# Patient Record
Sex: Female | Born: 1955 | Race: White | Hispanic: No | Marital: Married | Smoking: Former smoker
Health system: Southern US, Community
[De-identification: ages and names within clinical notes are randomized; demographics above are authoritative.]

## PROBLEM LIST (undated history)

## (undated) DIAGNOSIS — F419 Anxiety disorder, unspecified: Secondary | ICD-10-CM

## (undated) DIAGNOSIS — I10 Essential (primary) hypertension: Secondary | ICD-10-CM

## (undated) DIAGNOSIS — IMO0001 Reserved for inherently not codable concepts without codable children: Secondary | ICD-10-CM

## (undated) DIAGNOSIS — T7840XA Allergy, unspecified, initial encounter: Secondary | ICD-10-CM

## (undated) DIAGNOSIS — E785 Hyperlipidemia, unspecified: Secondary | ICD-10-CM

## (undated) DIAGNOSIS — K219 Gastro-esophageal reflux disease without esophagitis: Secondary | ICD-10-CM

## (undated) DIAGNOSIS — F32A Depression, unspecified: Secondary | ICD-10-CM

## (undated) DIAGNOSIS — M1990 Unspecified osteoarthritis, unspecified site: Secondary | ICD-10-CM

## (undated) DIAGNOSIS — I341 Nonrheumatic mitral (valve) prolapse: Secondary | ICD-10-CM

## (undated) HISTORY — DX: Unspecified osteoarthritis, unspecified site: M19.90

## (undated) HISTORY — DX: Anxiety disorder, unspecified: F41.9

## (undated) HISTORY — DX: Hyperlipidemia, unspecified: E78.5

## (undated) HISTORY — DX: Nonrheumatic mitral (valve) prolapse: I34.1

## (undated) HISTORY — DX: Gastro-esophageal reflux disease without esophagitis: K21.9

## (undated) HISTORY — DX: Reserved for inherently not codable concepts without codable children: IMO0001

## (undated) HISTORY — DX: Essential (primary) hypertension: I10

## (undated) HISTORY — PX: COLONOSCOPY: SHX174

## (undated) HISTORY — DX: Depression, unspecified: F32.A

## (undated) HISTORY — DX: Allergy, unspecified, initial encounter: T78.40XA

---

## 2002-09-04 ENCOUNTER — Ambulatory Visit (HOSPITAL_COMMUNITY): Admission: RE | Admit: 2002-09-04 | Discharge: 2002-09-04 | Payer: Self-pay | Admitting: Pulmonary Disease

## 2004-12-20 ENCOUNTER — Ambulatory Visit (HOSPITAL_COMMUNITY): Admission: RE | Admit: 2004-12-20 | Discharge: 2004-12-20 | Payer: Self-pay | Admitting: Pulmonary Disease

## 2006-02-26 ENCOUNTER — Ambulatory Visit (HOSPITAL_COMMUNITY): Admission: RE | Admit: 2006-02-26 | Discharge: 2006-02-26 | Payer: Self-pay | Admitting: Pulmonary Disease

## 2006-04-22 ENCOUNTER — Ambulatory Visit: Payer: Self-pay | Admitting: Orthopedic Surgery

## 2006-04-23 ENCOUNTER — Encounter (HOSPITAL_COMMUNITY): Admission: RE | Admit: 2006-04-23 | Discharge: 2006-05-23 | Payer: Self-pay | Admitting: Orthopedic Surgery

## 2006-05-13 ENCOUNTER — Ambulatory Visit: Payer: Self-pay | Admitting: Orthopedic Surgery

## 2007-02-27 ENCOUNTER — Ambulatory Visit (HOSPITAL_COMMUNITY): Admission: RE | Admit: 2007-02-27 | Discharge: 2007-02-27 | Payer: Self-pay | Admitting: Family Medicine

## 2007-03-11 ENCOUNTER — Ambulatory Visit (HOSPITAL_COMMUNITY): Admission: RE | Admit: 2007-03-11 | Discharge: 2007-03-11 | Payer: Self-pay | Admitting: Family Medicine

## 2007-07-14 ENCOUNTER — Ambulatory Visit: Payer: Self-pay | Admitting: Orthopedic Surgery

## 2008-04-14 ENCOUNTER — Ambulatory Visit (HOSPITAL_COMMUNITY): Admission: RE | Admit: 2008-04-14 | Discharge: 2008-04-14 | Payer: Self-pay | Admitting: Family Medicine

## 2008-05-03 ENCOUNTER — Ambulatory Visit (HOSPITAL_COMMUNITY): Admission: RE | Admit: 2008-05-03 | Discharge: 2008-05-03 | Payer: Self-pay | Admitting: Family Medicine

## 2008-10-07 ENCOUNTER — Ambulatory Visit (HOSPITAL_COMMUNITY): Admission: RE | Admit: 2008-10-07 | Discharge: 2008-10-07 | Payer: Self-pay | Admitting: Family Medicine

## 2010-02-21 ENCOUNTER — Ambulatory Visit (HOSPITAL_COMMUNITY): Admission: RE | Admit: 2010-02-21 | Discharge: 2010-02-21 | Payer: Self-pay | Admitting: Family Medicine

## 2011-05-16 ENCOUNTER — Other Ambulatory Visit (HOSPITAL_COMMUNITY): Payer: Self-pay | Admitting: Family Medicine

## 2011-05-16 DIAGNOSIS — Z139 Encounter for screening, unspecified: Secondary | ICD-10-CM

## 2011-05-21 ENCOUNTER — Ambulatory Visit (HOSPITAL_COMMUNITY)
Admission: RE | Admit: 2011-05-21 | Discharge: 2011-05-21 | Disposition: A | Discharge status: Home / Self Care | Payer: 59 | Source: Ambulatory Visit | Attending: Family Medicine | Admitting: Family Medicine

## 2011-05-21 DIAGNOSIS — Z139 Encounter for screening, unspecified: Secondary | ICD-10-CM

## 2011-05-21 DIAGNOSIS — Z1231 Encounter for screening mammogram for malignant neoplasm of breast: Secondary | ICD-10-CM | POA: Insufficient documentation

## 2012-04-23 ENCOUNTER — Ambulatory Visit (INDEPENDENT_AMBULATORY_CARE_PROVIDER_SITE_OTHER): Payer: 59 | Admitting: Orthopedic Surgery

## 2012-04-23 ENCOUNTER — Ambulatory Visit (HOSPITAL_COMMUNITY)
Admission: RE | Admit: 2012-04-23 | Discharge: 2012-04-23 | Disposition: A | Discharge status: Home / Self Care | Payer: 59 | Source: Ambulatory Visit | Attending: Family Medicine | Admitting: Family Medicine

## 2012-04-23 ENCOUNTER — Encounter: Payer: Self-pay | Admitting: Orthopedic Surgery

## 2012-04-23 ENCOUNTER — Other Ambulatory Visit (HOSPITAL_COMMUNITY): Payer: Self-pay | Admitting: Family Medicine

## 2012-04-23 VITALS — BP 130/90 | Ht 64.0 in | Wt 172.0 lb

## 2012-04-23 DIAGNOSIS — M25579 Pain in unspecified ankle and joints of unspecified foot: Secondary | ICD-10-CM

## 2012-04-23 DIAGNOSIS — X58XXXA Exposure to other specified factors, initial encounter: Secondary | ICD-10-CM | POA: Insufficient documentation

## 2012-04-23 DIAGNOSIS — S92309A Fracture of unspecified metatarsal bone(s), unspecified foot, initial encounter for closed fracture: Secondary | ICD-10-CM

## 2012-04-23 DIAGNOSIS — M79609 Pain in unspecified limb: Secondary | ICD-10-CM | POA: Insufficient documentation

## 2012-04-23 NOTE — Patient Instructions (Signed)
Ice and elevation x 48 hrs   Wear brace on foot   Weight as bearing as tolerated   RTW Monday

## 2012-04-23 NOTE — Progress Notes (Signed)
  Subjective:    Patient ID: Amy Patel, female    DOB: 1956-09-17, 56 y.o.   MRN: [id number]  Foot Injury  The incident occurred 12 to 24 hours ago. The incident occurred at home. The injury mechanism was a fall. The pain is present in the right foot. The quality of the pain is described as aching. The pain is at a severity of 2/10. The pain is mild. The pain has been intermittent since onset. Associated symptoms include an inability to bear weight and a loss of motion. Associated symptoms comments: Bruising and swelling.      Review of Systems  Skin:       Skin changes   All other systems reviewed and are negative.       Objective:   Physical Exam  Constitutional: She is oriented to person, place, and time. She appears well-developed and well-nourished.  HENT:  Head: Normocephalic.  Neck: Normal range of motion.  Cardiovascular: Normal rate.   Pulmonary/Chest: Effort normal.  Abdominal: She exhibits no distension.  Musculoskeletal:       Right shoulder: Normal.       Left shoulder: Normal.       Right hip: Normal.       Right knee: Normal.       Right ankle: She exhibits swelling and ecchymosis. She exhibits normal range of motion, no deformity, no laceration and normal pulse. tenderness. No lateral malleolus, no medial malleolus, no AITFL, no CF ligament, no posterior TFL, no head of 5th metatarsal and no proximal fibula tenderness found.       Tenderness and swelling over the proximal 5th metatarsal, the Achilles tendon is intact in the anterior talofibular ligament is nontender  Neurological: She is alert and oriented to person, place, and time. She has normal reflexes.  Skin: Skin is warm and dry.  Psychiatric: She has a normal mood and affect. Her behavior is normal. Judgment and thought content normal.    X-ray shows a proximal 5th metatarsal avulsion-type fracture Hospital film      Assessment & Plan:  Proximal 5th metatarsal fracture.  Cam Walker  weight-bear as tolerated for 6 weeks repeat x-ray

## 2012-06-03 ENCOUNTER — Telehealth: Payer: Self-pay | Admitting: Orthopedic Surgery

## 2012-06-03 NOTE — Telephone Encounter (Signed)
Amy Patel cancelled her appointment for 06/04/12 due to her work schedule.  She was scheduled to get an XR at this appointment. She wanted you to know that she will get the XR done 06/04/12 at [facility] and asked if you will look at it on line and let her know if she  Needs to see you again or if you have any further recommendations for her. Her cell # is [phone]  Work # is [phone]

## 2012-06-04 ENCOUNTER — Ambulatory Visit: Payer: 59 | Admitting: Orthopedic Surgery

## 2012-06-04 ENCOUNTER — Other Ambulatory Visit (HOSPITAL_COMMUNITY): Payer: Self-pay | Admitting: Family Medicine

## 2012-06-04 ENCOUNTER — Ambulatory Visit (HOSPITAL_COMMUNITY)
Admission: RE | Admit: 2012-06-04 | Discharge: 2012-06-04 | Disposition: A | Discharge status: Home / Self Care | Payer: 59 | Source: Ambulatory Visit | Attending: Family Medicine | Admitting: Family Medicine

## 2012-06-04 DIAGNOSIS — S92909A Unspecified fracture of unspecified foot, initial encounter for closed fracture: Secondary | ICD-10-CM

## 2012-06-04 DIAGNOSIS — Z4789 Encounter for other orthopedic aftercare: Secondary | ICD-10-CM | POA: Insufficient documentation

## 2012-06-04 NOTE — Telephone Encounter (Signed)
Marijean Niemann, will you call these instructions to the patient?

## 2012-06-04 NOTE — Telephone Encounter (Signed)
Remove brace if not having pain   Continue 2 more weeks if having pain

## 2012-06-06 NOTE — Telephone Encounter (Signed)
Left patient voicemail.

## 2012-08-26 ENCOUNTER — Other Ambulatory Visit (HOSPITAL_COMMUNITY): Payer: Self-pay | Admitting: Pulmonary Disease

## 2012-08-26 DIAGNOSIS — Z139 Encounter for screening, unspecified: Secondary | ICD-10-CM

## 2012-08-27 ENCOUNTER — Other Ambulatory Visit (HOSPITAL_COMMUNITY): Payer: Self-pay | Admitting: Family Medicine

## 2012-08-27 DIAGNOSIS — Z139 Encounter for screening, unspecified: Secondary | ICD-10-CM

## 2012-09-08 ENCOUNTER — Ambulatory Visit (HOSPITAL_COMMUNITY)
Admission: RE | Admit: 2012-09-08 | Discharge: 2012-09-08 | Disposition: A | Discharge status: Home / Self Care | Payer: 59 | Source: Ambulatory Visit | Attending: Pulmonary Disease | Admitting: Pulmonary Disease

## 2012-09-08 ENCOUNTER — Ambulatory Visit (HOSPITAL_COMMUNITY)
Admission: RE | Admit: 2012-09-08 | Discharge: 2012-09-08 | Disposition: A | Discharge status: Home / Self Care | Payer: 59 | Source: Ambulatory Visit | Attending: Family Medicine | Admitting: Family Medicine

## 2012-09-08 DIAGNOSIS — Z13820 Encounter for screening for osteoporosis: Secondary | ICD-10-CM | POA: Insufficient documentation

## 2012-09-08 DIAGNOSIS — Z1231 Encounter for screening mammogram for malignant neoplasm of breast: Secondary | ICD-10-CM | POA: Insufficient documentation

## 2012-09-08 DIAGNOSIS — Z139 Encounter for screening, unspecified: Secondary | ICD-10-CM

## 2012-09-08 DIAGNOSIS — E559 Vitamin D deficiency, unspecified: Secondary | ICD-10-CM | POA: Insufficient documentation

## 2014-02-09 ENCOUNTER — Other Ambulatory Visit (HOSPITAL_COMMUNITY): Payer: Self-pay | Admitting: Pulmonary Disease

## 2014-02-09 DIAGNOSIS — Z139 Encounter for screening, unspecified: Secondary | ICD-10-CM

## 2014-02-11 ENCOUNTER — Ambulatory Visit (HOSPITAL_COMMUNITY)
Admission: RE | Admit: 2014-02-11 | Discharge: 2014-02-11 | Disposition: A | Discharge status: Home / Self Care | Payer: 59 | Source: Ambulatory Visit | Attending: Pulmonary Disease | Admitting: Pulmonary Disease

## 2014-02-11 DIAGNOSIS — Z139 Encounter for screening, unspecified: Secondary | ICD-10-CM

## 2014-02-11 DIAGNOSIS — Z1231 Encounter for screening mammogram for malignant neoplasm of breast: Secondary | ICD-10-CM | POA: Insufficient documentation

## 2014-10-14 ENCOUNTER — Ambulatory Visit (INDEPENDENT_AMBULATORY_CARE_PROVIDER_SITE_OTHER): Payer: BC Managed Care – PPO | Admitting: Orthopedic Surgery

## 2014-10-14 ENCOUNTER — Ambulatory Visit (INDEPENDENT_AMBULATORY_CARE_PROVIDER_SITE_OTHER): Payer: BC Managed Care – PPO

## 2014-10-14 ENCOUNTER — Encounter: Payer: Self-pay | Admitting: Orthopedic Surgery

## 2014-10-14 VITALS — BP 149/91 | Ht 64.0 in | Wt 198.0 lb

## 2014-10-14 DIAGNOSIS — S83242A Other tear of medial meniscus, current injury, left knee, initial encounter: Secondary | ICD-10-CM

## 2014-10-14 DIAGNOSIS — M25562 Pain in left knee: Secondary | ICD-10-CM

## 2014-10-14 NOTE — Progress Notes (Signed)
Patient ID: Amy Patel, female   DOB: 05/16/1956, 58 y.o.   MRN: [id number] Patient ID: Amy Patel, female   DOB: 12-31-1955, 58 y.o.   MRN: [id number]  Chief Complaint  Patient presents with  . Knee Pain    left knee pain, no known injury    HPI Amy Patel is a 58 y.o. female.  Who started having pain in her knee when she was going up and down the stairs and doing Christmas decorations including the Christmas tree. Nothing specific in the beginning just some pain. This increased during a trip to [place] to improve pain swelling and popping. She was treated with 60 mg of prednisone for 3 days and 50 mg times a day along with the brace. She was doing okay until yesterday after shopping the knee popped role sort of gave way and pain became to 10 out of 10 she can extend the knee or weight-bear. Her pain is primarily across the medial joint line and is associated with a little bit of swelling. No locking. Pain is somewhat dull and achy today is 1 out of 10. She was also treated with ice and Advil and bracing as stated.   HPI  Review of Systems Review of Systems  She reported her review of systems as 14 systems being negative Past Medical History  Diagnosis Date  . Reflux   . MVP (mitral valve prolapse)   . Hyperlipidemia     No past surgical history on file.  Family History  Problem Relation Age of Onset  . Heart disease    . Arthritis    . Cancer      Social History History  Substance Use Topics  . Smoking status: Never Smoker   . Smokeless tobacco: Not on file  . Alcohol Use: Yes    Allergies  Allergen Reactions  . Penicillins     Current Outpatient Prescriptions  Medication Sig Dispense Refill  . aspirin 81 MG tablet Take 81 mg by mouth daily.    . Azilsartan Medoxomil (EDARBI) 80 MG TABS Take by mouth.    . dexlansoprazole (DEXILANT) 60 MG capsule Take 60 mg by mouth daily.    . Nebivolol HCl (BYSTOLIC) 20 MG TABS Take by mouth.    .  rosuvastatin (CRESTOR) 10 MG tablet Take 10 mg by mouth daily.     No current facility-administered medications for this visit.       Physical Exam Blood pressure 149/91, height 5\' 4"  (1.626 m), weight 198 lb (89.812 kg). Physical Exam Vitals are stable as recorded, appearance is normal grooming is normal as well. She is oriented 3. Affect are normal. She is ambulatory with no assistive device (the brace and there is a slight limp and she turns the foot out weightbearing.  She is tender over the medial joint line. Her range of motion is full but painful at terminal flexion she has a positive Apley's test positive McMurray sign her knee remains stable motor exam is normal skin is intact she has a good pulse in her sensation is normal in the left leg  Right knee exam is normal Upper extremities no abnormalities are detected.  Data Reviewed Independent interpretation of today's x-ray joint effusion with very minimal medial joint space narrowing  Assessment    Knee arthritis Joint effusion Torn medial meniscus    Plan    Aspiration injection left knee continue brace and anti-inflammatories. Recheck after the holidays or sooner if symptoms warrant  Procedure note injection and aspiration left knee joint  Verbal consent was obtained to aspirate and inject the left knee joint   Timeout was completed to confirm the site of aspiration and injection  An 18-gauge needle was used to aspirate the left knee joint from a suprapatellar lateral approach.  The medications used were 40 mg of Depo-Medrol and 1% lidocaine 3 cc  Anesthesia was provided by ethyl chloride and the skin was prepped with alcohol.  After cleaning the skin with alcohol an 18-gauge needle was used to aspirate the right knee joint.  We obtained 10 cc of fluid  We follow this by injection of 40 mg of Depo-Medrol and 3 cc 1% lidocaine.  There were no complications. A sterile bandage was applied.

## 2014-10-14 NOTE — Patient Instructions (Signed)
Ice as needed  Activity as tolerated   Wear brace  Anti inflammatories

## 2014-11-11 ENCOUNTER — Ambulatory Visit: Payer: BC Managed Care – PPO | Admitting: Orthopedic Surgery

## 2014-11-11 ENCOUNTER — Telehealth: Payer: Self-pay | Admitting: Orthopedic Surgery

## 2014-11-11 NOTE — Telephone Encounter (Signed)
Patient needs to cancel appointment due to being ill today - I offered re-schedule -  She opts to call back to re-schedule; wants Dr Aline Brochure to know "knee is better".

## 2014-11-22 ENCOUNTER — Encounter: Payer: Self-pay | Admitting: Orthopedic Surgery

## 2014-11-22 ENCOUNTER — Ambulatory Visit (INDEPENDENT_AMBULATORY_CARE_PROVIDER_SITE_OTHER): Payer: BLUE CROSS/BLUE SHIELD | Admitting: Orthopedic Surgery

## 2014-11-22 VITALS — BP 138/80 | Ht 64.0 in | Wt 198.0 lb

## 2014-11-22 DIAGNOSIS — S83241D Other tear of medial meniscus, current injury, right knee, subsequent encounter: Secondary | ICD-10-CM

## 2014-11-22 NOTE — Progress Notes (Signed)
Chief Complaint  Patient presents with  . Follow-up    4 week recheck on left knee.    History this is a 59 year old female who started having pain in her left knee around Christmas time after going up and down the stairs. This increased during a trip to [place] and was associated with pain and popping. She was treated with prednisone and knee bracing as well as Advil and did okay until she went on a shopping trip and then the knee gave way pain increased to 10 and she can weight-bear.  We gave her an injection she comes in for follow-up visit  The patient notes major improvement in her knee. Her pain is less her motion is improved she has no limp.  Review of systems she says she cut her left thumb with the [place] carcass back and things given and now has developed a nodule on the left thumb just proximal to the laceration with no signs of infection but a white lesion in the superficial skin.  Exam BP 138/80 mmHg  Ht 5\' 4"  (1.626 m)  Wt 198 lb (89.812 kg)  BMI 33.97 kg/m2  The examination of the left knee reveals normal ambulation. Mild medial joint line tenderness without effusion. Full range of motion. Ligament stable. Strength normal. Skin normal. McMurray sign negative.  Left thumb there is a white 2 x 2 mm superficial lesion in the subcutaneous tissue of the thumb which has no sign of erythema or infection mildly tender  Meniscal tear stable no surgery treatment needed.  Lesion on the left thumb observation  Follow-up as needed

## 2015-08-02 ENCOUNTER — Other Ambulatory Visit (HOSPITAL_COMMUNITY): Payer: Self-pay | Admitting: Pulmonary Disease

## 2015-08-02 DIAGNOSIS — Z1231 Encounter for screening mammogram for malignant neoplasm of breast: Secondary | ICD-10-CM

## 2015-08-08 ENCOUNTER — Ambulatory Visit (HOSPITAL_COMMUNITY)
Admission: RE | Admit: 2015-08-08 | Discharge: 2015-08-08 | Disposition: A | Discharge status: Home / Self Care | Payer: BLUE CROSS/BLUE SHIELD | Source: Ambulatory Visit | Attending: Pulmonary Disease | Admitting: Pulmonary Disease

## 2015-08-08 DIAGNOSIS — Z1231 Encounter for screening mammogram for malignant neoplasm of breast: Secondary | ICD-10-CM | POA: Insufficient documentation

## 2016-01-24 ENCOUNTER — Ambulatory Visit (INDEPENDENT_AMBULATORY_CARE_PROVIDER_SITE_OTHER): Payer: BLUE CROSS/BLUE SHIELD | Admitting: Orthopedic Surgery

## 2016-01-24 VITALS — BP 136/73 | HR 68 | Ht 64.0 in | Wt 198.0 lb

## 2016-01-24 DIAGNOSIS — M25562 Pain in left knee: Secondary | ICD-10-CM

## 2016-01-24 DIAGNOSIS — S83241D Other tear of medial meniscus, current injury, right knee, subsequent encounter: Secondary | ICD-10-CM

## 2016-01-24 NOTE — Progress Notes (Signed)
60 year old female [place] in her knee started hurting again. We saw her back in this January and she was thought to have a medial meniscal tear that was stable and didn't require surgery  Requests repeat injection in the left knee which was given  Procedure note left knee injection verbal consent was obtained to inject left knee joint  Timeout was completed to confirm the site of injection  The medications used were 40 mg of Depo-Medrol and 1% lidocaine 3 cc  Anesthesia was provided by ethyl chloride and the skin was prepped with alcohol.  After cleaning the skin with alcohol a 20-gauge needle was used to inject the left knee joint. There were no complications. A sterile bandage was applied.

## 2016-02-16 ENCOUNTER — Ambulatory Visit: Payer: BLUE CROSS/BLUE SHIELD | Admitting: Orthopedic Surgery

## 2017-02-12 DIAGNOSIS — M1712 Unilateral primary osteoarthritis, left knee: Secondary | ICD-10-CM | POA: Diagnosis not present

## 2017-02-12 DIAGNOSIS — Z87891 Personal history of nicotine dependence: Secondary | ICD-10-CM | POA: Diagnosis not present

## 2017-02-12 DIAGNOSIS — I10 Essential (primary) hypertension: Secondary | ICD-10-CM | POA: Diagnosis not present

## 2017-02-12 DIAGNOSIS — F411 Generalized anxiety disorder: Secondary | ICD-10-CM | POA: Diagnosis not present

## 2017-02-26 ENCOUNTER — Other Ambulatory Visit: Payer: Self-pay | Admitting: Pulmonary Disease

## 2017-02-26 DIAGNOSIS — R05 Cough: Secondary | ICD-10-CM

## 2017-02-26 DIAGNOSIS — R059 Cough, unspecified: Secondary | ICD-10-CM

## 2017-02-28 ENCOUNTER — Ambulatory Visit (HOSPITAL_COMMUNITY)
Admission: RE | Admit: 2017-02-28 | Discharge: 2017-02-28 | Disposition: A | Discharge status: Home / Self Care | Payer: 59 | Source: Ambulatory Visit | Attending: Pulmonary Disease | Admitting: Pulmonary Disease

## 2017-02-28 DIAGNOSIS — R05 Cough: Secondary | ICD-10-CM | POA: Insufficient documentation

## 2017-02-28 DIAGNOSIS — R059 Cough, unspecified: Secondary | ICD-10-CM

## 2017-02-28 DIAGNOSIS — R0602 Shortness of breath: Secondary | ICD-10-CM | POA: Diagnosis not present

## 2017-06-12 DIAGNOSIS — H5213 Myopia, bilateral: Secondary | ICD-10-CM | POA: Diagnosis not present

## 2017-06-12 DIAGNOSIS — H524 Presbyopia: Secondary | ICD-10-CM | POA: Diagnosis not present

## 2017-06-12 DIAGNOSIS — H52223 Regular astigmatism, bilateral: Secondary | ICD-10-CM | POA: Diagnosis not present

## 2017-08-09 DIAGNOSIS — Z23 Encounter for immunization: Secondary | ICD-10-CM | POA: Diagnosis not present

## 2018-03-11 DIAGNOSIS — Z87891 Personal history of nicotine dependence: Secondary | ICD-10-CM | POA: Diagnosis not present

## 2018-03-11 DIAGNOSIS — F411 Generalized anxiety disorder: Secondary | ICD-10-CM | POA: Diagnosis not present

## 2018-03-11 DIAGNOSIS — E782 Mixed hyperlipidemia: Secondary | ICD-10-CM | POA: Diagnosis not present

## 2018-03-11 DIAGNOSIS — I10 Essential (primary) hypertension: Secondary | ICD-10-CM | POA: Diagnosis not present

## 2018-03-18 ENCOUNTER — Other Ambulatory Visit (HOSPITAL_COMMUNITY): Payer: Self-pay | Admitting: Pulmonary Disease

## 2018-03-18 DIAGNOSIS — Z1231 Encounter for screening mammogram for malignant neoplasm of breast: Secondary | ICD-10-CM

## 2018-03-25 ENCOUNTER — Ambulatory Visit (INDEPENDENT_AMBULATORY_CARE_PROVIDER_SITE_OTHER): Payer: 59

## 2018-03-25 ENCOUNTER — Encounter: Payer: Self-pay | Admitting: Orthopedic Surgery

## 2018-03-25 ENCOUNTER — Ambulatory Visit (INDEPENDENT_AMBULATORY_CARE_PROVIDER_SITE_OTHER): Payer: 59 | Admitting: Orthopedic Surgery

## 2018-03-25 VITALS — BP 138/90 | HR 80 | Ht 64.0 in | Wt 197.0 lb

## 2018-03-25 DIAGNOSIS — M25561 Pain in right knee: Secondary | ICD-10-CM

## 2018-03-25 DIAGNOSIS — M25461 Effusion, right knee: Secondary | ICD-10-CM | POA: Diagnosis not present

## 2018-03-25 NOTE — Progress Notes (Signed)
Progress Note//new problem  Patient ID: Amy Patel, female   DOB: Apr 06, 1956, 62 y.o.   MRN: [id number] Chief Complaint  Patient presents with  . Follow-up    Right knee pain     Medical decision-making  Imaging:  Xrays ordered: y/n ? Yes  The dictated report x-ray shows symmetric joint space narrowing which is minor normal alignment in the tibiofemoral joint and patellofemoral joint  Encounter Diagnosis  Name Primary?  . Right knee pain, unspecified chronicity Yes     PLAN:  aspiration and injection Procedure note injection and aspiration right knee joint  Verbal consent was obtained to aspirate and inject the right knee joint   Timeout was completed to confirm the site of aspiration and injection  An 18-gauge needle was used to aspirate the knee joint from a suprapatellar lateral approach.  The medications used were 40 mg of Depo-Medrol and 1% lidocaine 3 cc  Anesthesia was provided by ethyl chloride and the skin was prepped with alcohol.  After cleaning the skin with alcohol an 18-gauge needle was used to aspirate the right knee joint.  We obtained 10  cc of fluid, clear   We follow this by injection of 40 mg of Depo-Medrol and 3 cc 1% lidocaine.  There were no complications. A sterile bandage was applied.   Chief Complaint  Patient presents with  . Follow-up    Right knee pain    62 years old presents for evaluation of pain and swelling right knee  This patient is 62 years old she was wearing some high heels and twisted her right knee back in April initially took a prednisone Dosepak seem to improve and then over the last 3 to 4 weeks had increased dull medial pain right knee and swelling decreased flexion without giving way catching locking or weakness.    Review of Systems  Constitutional: Negative for fever.  Skin: Negative for rash.  Neurological: Negative for tingling and weakness.   No outpatient medications have been marked as taking for  the 03/25/18 encounter (Office Visit) with Carole Civil, MD.    Past Medical History:  Diagnosis Date  . Hyperlipidemia   . MVP (mitral valve prolapse)   . Reflux      Allergies  Allergen Reactions  . Penicillins     BP 138/90   Pulse 80   Ht 5\' 4"  (1.626 m)   Wt 197 lb (89.4 kg)   BMI 33.81 kg/m    Physical Exam  Constitutional: She is oriented to person, place, and time. She appears well-developed and well-nourished.  Musculoskeletal:       Right knee: She exhibits effusion.  Neurological: She is alert and oriented to person, place, and time.  Psychiatric: She has a normal mood and affect. Judgment normal.  Vitals reviewed.   Right Knee Exam   Muscle Strength  The patient has normal right knee strength.  Tenderness  The patient is experiencing tenderness in the medial joint line.  Range of Motion  Extension: normal  Flexion: normal   Tests  McMurray:  Medial - negative Lateral - negative Varus: negative Valgus: negative Drawer:  Anterior - negative    Posterior - negative  Other  Erythema: absent Scars: absent Sensation: normal Pulse: present Swelling: none Effusion: effusion present   Left Knee Exam   Muscle Strength  The patient has normal left knee strength.  Tenderness  The patient is experiencing no tenderness.   Range of Motion  Extension: normal  Flexion: normal   Tests  McMurray:  Medial - negative Lateral - negative Varus: negative Valgus: negative Drawer:  Anterior - negative     Posterior - negative  Other  Erythema: absent Scars: absent Sensation: normal Pulse: present Swelling: none      Arther Abbott, MD  9:02 AM 03/25/2018

## 2018-03-25 NOTE — Patient Instructions (Signed)
Ice   advil

## 2018-03-27 ENCOUNTER — Encounter (HOSPITAL_COMMUNITY): Payer: Self-pay

## 2018-03-27 ENCOUNTER — Ambulatory Visit (HOSPITAL_COMMUNITY)
Admission: RE | Admit: 2018-03-27 | Discharge: 2018-03-27 | Disposition: A | Discharge status: Home / Self Care | Payer: 59 | Source: Ambulatory Visit | Attending: Pulmonary Disease | Admitting: Pulmonary Disease

## 2018-03-27 DIAGNOSIS — Z1231 Encounter for screening mammogram for malignant neoplasm of breast: Secondary | ICD-10-CM | POA: Diagnosis present

## 2018-04-22 ENCOUNTER — Encounter: Payer: Self-pay | Admitting: Gastroenterology

## 2018-05-20 ENCOUNTER — Encounter: Payer: Self-pay | Admitting: Orthopedic Surgery

## 2018-05-20 ENCOUNTER — Ambulatory Visit (INDEPENDENT_AMBULATORY_CARE_PROVIDER_SITE_OTHER): Payer: 59 | Admitting: Orthopedic Surgery

## 2018-05-20 VITALS — BP 140/79 | HR 73 | Ht 64.0 in | Wt 197.0 lb

## 2018-05-20 DIAGNOSIS — M23231 Derangement of other medial meniscus due to old tear or injury, right knee: Secondary | ICD-10-CM | POA: Diagnosis not present

## 2018-05-20 NOTE — Progress Notes (Signed)
Chief Complaint  Patient presents with  . Follow-up    Recheck on right knee.     62 year old female status post an aspiration injection right knee for pain and swelling after previous twisting injury in April did well for a small amount of time then was walking a few days back and the knee gave way.  She had been having pain every day despite taking Advil  Presents now with her husband needing surgery on Thursday and complaining of medial knee pain dull aching intermittent seems to be associated with weightbearing mild swelling  Review of Systems  Constitutional: Negative for chills and fever.  Musculoskeletal: Negative for back pain.  Neurological: Negative for tingling.   Past Medical History:  Diagnosis Date  . Hyperlipidemia   . MVP (mitral valve prolapse)   . Reflux    BP 140/79   Pulse 73   Ht 5\' 4"  (1.626 m)   Wt 197 lb (89.4 kg)   BMI 33.81 kg/m  Physical Exam  Constitutional: She is oriented to person, place, and time. She appears well-developed and well-nourished.  Neurological: She is alert and oriented to person, place, and time.  Psychiatric: She has a normal mood and affect. Judgment normal.  Vitals reviewed.  Knee:  Right knee medial joint line tenderness, no effusion Full range of motion with pain in extension and terminal flexion No instability but positive McMurray sign positive screw home test Assessment normal motor exam grade 5 Skin normal without any lesions or rashes Normal dorsalis pedis pulse normal color Normal sensation in the right foot  Left knee NROM, normal appearance, normal skin    Eco hinge brace  Right knee injection    Encounter Diagnosis  Name Primary?  Thelma Comp of medial meniscus due to old tear/inj, right knee Yes   Procedure note right knee injection verbal consent was obtained to inject right knee joint  Timeout was completed to confirm the site of injection  The medications used were 40 mg of Depo-Medrol and 1%  lidocaine 3 cc  Anesthesia was provided by ethyl chloride and the skin was prepped with alcohol.  After cleaning the skin with alcohol a 20-gauge needle was used to inject the right knee joint. There were no complications. A sterile bandage was applied.

## 2018-05-21 ENCOUNTER — Ambulatory Visit (AMBULATORY_SURGERY_CENTER): Payer: Self-pay

## 2018-05-21 VITALS — Ht 64.0 in | Wt 208.8 lb

## 2018-05-21 DIAGNOSIS — Z1211 Encounter for screening for malignant neoplasm of colon: Secondary | ICD-10-CM

## 2018-05-21 MED ORDER — NA SULFATE-K SULFATE-MG SULF 17.5-3.13-1.6 GM/177ML PO SOLN: 1.0000 | Freq: Once | ORAL | 0 refills | Status: AC

## 2018-05-21 NOTE — Progress Notes (Signed)
Denies allergies to eggs or soy products. Denies complication of anesthesia or sedation. Denies use of weight loss medication. Denies use of O2.   Emmi instructions declined.  

## 2018-05-27 ENCOUNTER — Encounter: Payer: Self-pay | Admitting: Gastroenterology

## 2018-06-03 DIAGNOSIS — R945 Abnormal results of liver function studies: Secondary | ICD-10-CM | POA: Diagnosis not present

## 2018-06-03 DIAGNOSIS — Z0000 Encounter for general adult medical examination without abnormal findings: Secondary | ICD-10-CM | POA: Diagnosis not present

## 2018-06-06 ENCOUNTER — Ambulatory Visit (AMBULATORY_SURGERY_CENTER): Payer: 59 | Admitting: Gastroenterology

## 2018-06-06 ENCOUNTER — Encounter: Payer: Self-pay | Admitting: Gastroenterology

## 2018-06-06 VITALS — BP 110/64 | HR 76 | Temp 99.3°F | Resp 21 | Ht 64.0 in | Wt 208.0 lb

## 2018-06-06 DIAGNOSIS — D123 Benign neoplasm of transverse colon: Secondary | ICD-10-CM

## 2018-06-06 DIAGNOSIS — D120 Benign neoplasm of cecum: Secondary | ICD-10-CM | POA: Diagnosis not present

## 2018-06-06 DIAGNOSIS — K635 Polyp of colon: Secondary | ICD-10-CM

## 2018-06-06 DIAGNOSIS — I10 Essential (primary) hypertension: Secondary | ICD-10-CM | POA: Diagnosis not present

## 2018-06-06 DIAGNOSIS — Z1211 Encounter for screening for malignant neoplasm of colon: Secondary | ICD-10-CM | POA: Diagnosis not present

## 2018-06-06 DIAGNOSIS — D125 Benign neoplasm of sigmoid colon: Secondary | ICD-10-CM

## 2018-06-06 DIAGNOSIS — D124 Benign neoplasm of descending colon: Secondary | ICD-10-CM | POA: Diagnosis not present

## 2018-06-06 MED ORDER — SODIUM CHLORIDE 0.9 % IV SOLN: 500.0000 mL | Freq: Once | INTRAVENOUS | Status: DC

## 2018-06-06 NOTE — Patient Instructions (Signed)
Please read handouts on Polyps, Diverticulosis, and Hemorrhoids. Continue present medications.    YOU HAD AN ENDOSCOPIC PROCEDURE TODAY AT [facility]:   Refer to the procedure report that was given to you for any specific questions about what was found during the examination.  If the procedure report does not answer your questions, please call your gastroenterologist to clarify.  If you requested that your care partner not be given the details of your procedure findings, then the procedure report has been included in a sealed envelope for you to review at your convenience later.  YOU SHOULD EXPECT: Some feelings of bloating in the abdomen. Passage of more gas than usual.  Walking can help get rid of the air that was put into your GI tract during the procedure and reduce the bloating. If you had a lower endoscopy (such as a colonoscopy or flexible sigmoidoscopy) you may notice spotting of blood in your stool or on the toilet paper. If you underwent a bowel prep for your procedure, you may not have a normal bowel movement for a few days.  Please Note:  You might notice some irritation and congestion in your nose or some drainage.  This is from the oxygen used during your procedure.  There is no need for concern and it should clear up in a day or so.  SYMPTOMS TO REPORT IMMEDIATELY:   Following lower endoscopy (colonoscopy or flexible sigmoidoscopy):  Excessive amounts of blood in the stool  Significant tenderness or worsening of abdominal pains  Swelling of the abdomen that is new, acute  Fever of 100F or higher    For urgent or emergent issues, a gastroenterologist can be reached at any hour by calling [phone].   DIET:  We do recommend a small meal at first, but then you may proceed to your regular diet.  Drink plenty of fluids but you should avoid alcoholic beverages for 24 hours.  ACTIVITY:  You should plan to take it easy for the rest of today and you should NOT  DRIVE or use heavy machinery until tomorrow (because of the sedation medicines used during the test).    FOLLOW UP: Our staff will call the number listed on your records the next business day following your procedure to check on you and address any questions or concerns that you may have regarding the information given to you following your procedure. If we do not reach you, we will leave a message.  However, if you are feeling well and you are not experiencing any problems, there is no need to return our call.  We will assume that you have returned to your regular daily activities without incident.  If any biopsies were taken you will be contacted by phone or by letter within the next 1-3 weeks.  Please call us at [phone] if you have not heard about the biopsies in 3 weeks.    SIGNATURES/CONFIDENTIALITY: You and/or your care partner have signed paperwork which will be entered into your electronic medical record.  These signatures attest to the fact that that the information above on your After Visit Summary has been reviewed and is understood.  Full responsibility of the confidentiality of this discharge information lies with you and/or your care-partner.

## 2018-06-06 NOTE — Progress Notes (Signed)
Report to PACU, RN, vss, BBS= Clear.  

## 2018-06-06 NOTE — Progress Notes (Signed)
Called to room to assist during endoscopic procedure.  Patient ID and intended procedure confirmed with present staff. Received instructions for my participation in the procedure from the performing physician.  

## 2018-06-06 NOTE — Progress Notes (Signed)
Pt's states no medical or surgical changes since previsit or office visit. 

## 2018-06-06 NOTE — Op Note (Signed)
[place] Patient Name: Amy Patel Procedure Date: 06/06/2018 3:17 PM MRN: [id number] Endoscopist: Mauri Pole , MD Age: 62 Referring MD:  Date of Birth: [birth year] Gender: Female Account #: [id number] Procedure:                Colonoscopy Indications:              Screening for colorectal malignant neoplasm. Father                            had colon cancer at age >45 Medicines:                Monitored Anesthesia Care Procedure:                Pre-Anesthesia Assessment:                           - Prior to the procedure, a History and Physical                            was performed, and patient medications and                            allergies were reviewed. The patient's tolerance of                            previous anesthesia was also reviewed. The risks                            and benefits of the procedure and the sedation                            options and risks were discussed with the patient.                            All questions were answered, and informed consent                            was obtained. Prior Anticoagulants: The patient has                            taken no previous anticoagulant or antiplatelet                            agents. ASA Grade Assessment: II - A patient with                            mild systemic disease. After reviewing the risks                            and benefits, the patient was deemed in                            satisfactory condition to undergo the procedure.  After obtaining informed consent, the colonoscope                            was passed under direct vision. Throughout the                            procedure, the patient's blood pressure, pulse, and                            oxygen saturations were monitored continuously. The                            Model PCF-[id number]) scope was introduced                            through the anus and  advanced to the the cecum,                            identified by appendiceal orifice and ileocecal                            valve. The colonoscopy was performed without                            difficulty. The patient tolerated the procedure                            well. The quality of the bowel preparation was                            excellent. The ileocecal valve, appendiceal                            orifice, and rectum were photographed. Scope In: 3:25:08 PM Scope Out: 3:44:23 PM Scope Withdrawal Time: 0 hours 11 minutes 38 seconds  Total Procedure Duration: 0 hours 19 minutes 15 seconds  Findings:                 The perianal and digital rectal examinations were                            normal.                           Four sessile polyps were found in the descending                            colon and cecum. The polyps were 4 to 7 mm in size.                            These polyps were removed with a cold snare.                            Resection and retrieval were complete.  A 1 mm polyp was found in the transverse colon. The                            polyp was sessile. The polyp was removed with a                            cold biopsy forceps. Resection and retrieval were                            complete.                           A 11 mm polyp was found in the sigmoid colon. The                            polyp was pedunculated. The polyp was removed with                            a hot snare. Resection and retrieval were complete.                           Scattered small-mouthed diverticula were found in                            the sigmoid colon, descending colon and ascending                            colon.                           Non-bleeding internal hemorrhoids were found during                            retroflexion. The hemorrhoids were small. Complications:            No immediate complications. Estimated  Blood Loss:     Estimated blood loss was minimal. Impression:               - Four 4 to 7 mm polyps in the descending colon and                            in the cecum, removed with a cold snare. Resected                            and retrieved.                           - One 1 mm polyp in the transverse colon, removed                            with a cold biopsy forceps. Resected and retrieved.                           - One 11 mm polyp in the sigmoid colon,  removed                            with a hot snare. Resected and retrieved.                           - Mild diverticulosis in the sigmoid colon, in the                            descending colon and in the ascending colon.                           - Non-bleeding internal hemorrhoids. Recommendation:           - Patient has a contact number available for                            emergencies. The signs and symptoms of potential                            delayed complications were discussed with the                            patient. Return to normal activities tomorrow.                            Written discharge instructions were provided to the                            patient.                           - Resume previous diet.                           - Continue present medications.                           - Await pathology results.                           - Repeat colonoscopy in 3 - 5 years for                            surveillance based on pathology results. Mauri Pole, MD 06/06/2018 3:47:45 PM This report has been signed electronically.

## 2018-06-06 NOTE — Progress Notes (Deleted)
Called to room to assist during endoscopic procedure.  Patient ID and intended procedure confirmed with present staff. Received instructions for my participation in the procedure from the performing physician.  

## 2018-06-09 ENCOUNTER — Telehealth: Payer: Self-pay | Admitting: *Deleted

## 2018-06-09 NOTE — Telephone Encounter (Signed)
  Follow up Call-  Call back number 06/06/2018  Post procedure Call Back phone  # [phone]  Permission to leave phone message Yes  Some recent data might be hidden     Patient questions:  Do you have a fever, pain , or abdominal swelling? No. Pain Score  0 *  Have you tolerated food without any problems? Yes.    Have you been able to return to your normal activities? Yes.    Do you have any questions about your discharge instructions: Diet   No. Medications  No. Follow up visit  No.  Do you have questions or concerns about your Care? No.  Actions: * If pain score is 4 or above: No action needed, pain <4.

## 2018-06-12 ENCOUNTER — Encounter: Payer: Self-pay | Admitting: Gastroenterology

## 2018-08-03 DIAGNOSIS — Z23 Encounter for immunization: Secondary | ICD-10-CM | POA: Diagnosis not present

## 2018-08-22 DIAGNOSIS — Z23 Encounter for immunization: Secondary | ICD-10-CM | POA: Diagnosis not present

## 2018-09-17 DIAGNOSIS — R945 Abnormal results of liver function studies: Secondary | ICD-10-CM | POA: Diagnosis not present

## 2019-03-17 DIAGNOSIS — M1712 Unilateral primary osteoarthritis, left knee: Secondary | ICD-10-CM | POA: Diagnosis not present

## 2019-03-17 DIAGNOSIS — I10 Essential (primary) hypertension: Secondary | ICD-10-CM | POA: Diagnosis not present

## 2019-03-17 DIAGNOSIS — K635 Polyp of colon: Secondary | ICD-10-CM | POA: Diagnosis not present

## 2019-03-17 DIAGNOSIS — F3340 Major depressive disorder, recurrent, in remission, unspecified: Secondary | ICD-10-CM | POA: Diagnosis not present

## 2019-04-29 ENCOUNTER — Other Ambulatory Visit (HOSPITAL_COMMUNITY): Payer: Self-pay | Admitting: Pulmonary Disease

## 2019-04-29 DIAGNOSIS — Z1231 Encounter for screening mammogram for malignant neoplasm of breast: Secondary | ICD-10-CM

## 2019-05-06 ENCOUNTER — Ambulatory Visit (HOSPITAL_COMMUNITY): Payer: 59

## 2019-05-14 ENCOUNTER — Other Ambulatory Visit: Payer: Self-pay

## 2019-05-14 ENCOUNTER — Ambulatory Visit (HOSPITAL_COMMUNITY)
Admission: RE | Admit: 2019-05-14 | Discharge: 2019-05-14 | Disposition: A | Discharge status: Home / Self Care | Payer: 59 | Source: Ambulatory Visit | Attending: Pulmonary Disease | Admitting: Pulmonary Disease

## 2019-05-14 DIAGNOSIS — Z1231 Encounter for screening mammogram for malignant neoplasm of breast: Secondary | ICD-10-CM | POA: Diagnosis not present

## 2019-08-05 ENCOUNTER — Other Ambulatory Visit: Payer: Self-pay

## 2019-08-05 DIAGNOSIS — Z20822 Contact with and (suspected) exposure to covid-19: Secondary | ICD-10-CM

## 2019-08-07 LAB — NOVEL CORONAVIRUS, NAA: SARS-CoV-2, NAA: NOT DETECTED

## 2019-08-19 DIAGNOSIS — Z23 Encounter for immunization: Secondary | ICD-10-CM | POA: Diagnosis not present

## 2020-01-21 ENCOUNTER — Ambulatory Visit: Payer: Self-pay | Attending: Internal Medicine

## 2020-01-21 DIAGNOSIS — Z23 Encounter for immunization: Secondary | ICD-10-CM

## 2020-01-21 NOTE — Progress Notes (Signed)
   Covid-19 Vaccination Clinic  Name:  MADONNA TRATHEN    MRN: [id number] DOB: 1956/04/26  01/21/2020  Ms. Sill was observed post Covid-19 immunization for 15 minutes without incident. She was provided with Vaccine Information Sheet and instruction to access the V-Safe system.   Ms. Majure was instructed to call 911 with any severe reactions post vaccine: Marland Kitchen Difficulty breathing  . Swelling of face and throat  . A fast heartbeat  . A bad rash all over body  . Dizziness and weakness   Immunizations Administered    Name Date Dose VIS Date Route   Moderna COVID-19 Vaccine 01/21/2020 11:47 AM 0.5 mL 09/29/2019 Intramuscular   Manufacturer: Moderna   Lot: [id number]   [id number]

## 2020-02-23 ENCOUNTER — Ambulatory Visit: Payer: Self-pay | Attending: Internal Medicine

## 2020-02-23 DIAGNOSIS — Z23 Encounter for immunization: Secondary | ICD-10-CM

## 2020-02-23 NOTE — Progress Notes (Signed)
   Covid-19 Vaccination Clinic  Name:  KIERNEY ORECCHIO    MRN: [id number] DOB: January 31, 1956  02/23/2020  Ms. Heggie was observed post Covid-19 immunization for 15 minutes without incident. She was provided with Vaccine Information Sheet and instruction to access the V-Safe system.   Ms. Perry was instructed to call 911 with any severe reactions post vaccine: Marland Kitchen Difficulty breathing  . Swelling of face and throat  . A fast heartbeat  . A bad rash all over body  . Dizziness and weakness   Immunizations Administered    Name Date Dose VIS Date Route   Moderna COVID-19 Vaccine 02/23/2020 12:02 PM 0.5 mL 09/2019 Intramuscular   Manufacturer: Moderna   Lot: [id number]   [id number]

## 2020-04-15 ENCOUNTER — Other Ambulatory Visit (HOSPITAL_COMMUNITY): Payer: Self-pay | Admitting: Family Medicine

## 2020-05-19 DIAGNOSIS — H52223 Regular astigmatism, bilateral: Secondary | ICD-10-CM | POA: Diagnosis not present

## 2020-05-19 DIAGNOSIS — H5213 Myopia, bilateral: Secondary | ICD-10-CM | POA: Diagnosis not present

## 2020-05-19 DIAGNOSIS — H2513 Age-related nuclear cataract, bilateral: Secondary | ICD-10-CM | POA: Diagnosis not present

## 2020-05-19 DIAGNOSIS — H524 Presbyopia: Secondary | ICD-10-CM | POA: Diagnosis not present

## 2020-06-23 ENCOUNTER — Other Ambulatory Visit (HOSPITAL_COMMUNITY): Payer: Self-pay | Admitting: Family Medicine

## 2020-06-23 DIAGNOSIS — Z1231 Encounter for screening mammogram for malignant neoplasm of breast: Secondary | ICD-10-CM

## 2020-06-27 DIAGNOSIS — Z20828 Contact with and (suspected) exposure to other viral communicable diseases: Secondary | ICD-10-CM | POA: Diagnosis not present

## 2020-06-30 ENCOUNTER — Ambulatory Visit (HOSPITAL_COMMUNITY): Payer: Self-pay

## 2020-07-01 ENCOUNTER — Other Ambulatory Visit: Payer: Self-pay | Admitting: Physician Assistant

## 2020-07-01 DIAGNOSIS — U071 COVID-19: Secondary | ICD-10-CM

## 2020-07-01 NOTE — Progress Notes (Signed)
I connected by phone with Amy Patel on 07/01/2020 at 8:19 PM to discuss the potential use of a new treatment for mild to moderate COVID-19 viral infection in non-hospitalized patients.  This patient is a 64 y.o. female that meets the FDA criteria for Emergency Use Authorization of COVID monoclonal antibody casirivimab/imdevimab.  Has a (+) direct SARS-CoV-2 viral test result  Has mild or moderate COVID-19   Is NOT hospitalized due to COVID-19  Is within 10 days of symptom onset  Has at least one of the high risk factor(s) for progression to severe COVID-19 and/or hospitalization as defined in EUA.  Specific high risk criteria : BMI > 25 and Cardiovascular disease or hypertension   I have spoken and communicated the following to the patient or parent/caregiver regarding COVID monoclonal antibody treatment:  1. FDA has authorized the emergency use for the treatment of mild to moderate COVID-19 in adults and pediatric patients with positive results of direct SARS-CoV-2 viral testing who are 21 years of age and older weighing at least 40 kg, and who are at high risk for progressing to severe COVID-19 and/or hospitalization.  2. The significant known and potential risks and benefits of COVID monoclonal antibody, and the extent to which such potential risks and benefits are unknown.  3. Information on available alternative treatments and the risks and benefits of those alternatives, including clinical trials.  4. Patients treated with COVID monoclonal antibody should continue to self-isolate and use infection control measures (e.g., wear mask, isolate, social distance, avoid sharing personal items, clean and disinfect "high touch" surfaces, and frequent handwashing) according to CDC guidelines.   5. The patient or parent/caregiver has the option to accept or refuse COVID monoclonal antibody treatment.  After reviewing this information with the patient, The patient agreed to proceed with  receiving casirivimab\imdevimab infusion and will be provided a copy of the Fact sheet prior to receiving the infusion.  Sx onset 8/26. Set up for infusion on 9/4 @ 5pm. Directions given to [facility]. Pt is aware that insurance will be charged an infusion fee.   Angelena Form 07/01/2020 8:19 PM

## 2020-07-02 ENCOUNTER — Ambulatory Visit (HOSPITAL_COMMUNITY)
Admission: RE | Admit: 2020-07-02 | Discharge: 2020-07-02 | Disposition: A | Discharge status: Home / Self Care | Payer: 59 | Source: Ambulatory Visit | Attending: Pulmonary Disease | Admitting: Pulmonary Disease

## 2020-07-02 DIAGNOSIS — U071 COVID-19: Secondary | ICD-10-CM | POA: Insufficient documentation

## 2020-07-02 MED ORDER — DIPHENHYDRAMINE HCL 50 MG/ML IJ SOLN: 50.0000 mg | Freq: Once | INTRAMUSCULAR | Status: DC | PRN

## 2020-07-02 MED ORDER — SODIUM CHLORIDE 0.9 % IV SOLN
1200.0000 mg | Freq: Once | INTRAVENOUS | Status: AC
  Administered 2020-07-02: 1200 mg via INTRAVENOUS
  Filled 2020-07-02: qty 10

## 2020-07-02 MED ORDER — ALBUTEROL SULFATE HFA 108 (90 BASE) MCG/ACT IN AERS: 2.0000 | INHALATION_SPRAY | Freq: Once | RESPIRATORY_TRACT | Status: DC | PRN

## 2020-07-02 MED ORDER — FAMOTIDINE IN NACL 20-0.9 MG/50ML-% IV SOLN: 20.0000 mg | Freq: Once | INTRAVENOUS | Status: DC | PRN

## 2020-07-02 MED ORDER — METHYLPREDNISOLONE SODIUM SUCC 125 MG IJ SOLR: 125.0000 mg | Freq: Once | INTRAMUSCULAR | Status: DC | PRN

## 2020-07-02 MED ORDER — SODIUM CHLORIDE 0.9 % IV SOLN: INTRAVENOUS | Status: DC | PRN

## 2020-07-02 MED ORDER — EPINEPHRINE 0.3 MG/0.3ML IJ SOAJ: 0.3000 mg | Freq: Once | INTRAMUSCULAR | Status: DC | PRN

## 2020-07-02 NOTE — Progress Notes (Signed)
  Diagnosis: COVID-19  Physician: Asencion Noble, MD  Procedure: Covid Infusion Clinic Med: casirivimab\imdevimab infusion - Provided patient with casirivimab\imdevimab fact sheet for patients, parents and caregivers prior to infusion.  Complications: No immediate complications noted.  Discharge: Discharged home   Janne Napoleon 07/02/2020

## 2020-07-02 NOTE — Discharge Instructions (Signed)

## 2020-08-04 ENCOUNTER — Other Ambulatory Visit: Payer: Self-pay

## 2020-08-04 ENCOUNTER — Ambulatory Visit (HOSPITAL_COMMUNITY)
Admission: RE | Admit: 2020-08-04 | Discharge: 2020-08-04 | Disposition: A | Discharge status: Home / Self Care | Payer: 59 | Source: Ambulatory Visit | Attending: Family Medicine | Admitting: Family Medicine

## 2020-08-04 DIAGNOSIS — Z1231 Encounter for screening mammogram for malignant neoplasm of breast: Secondary | ICD-10-CM | POA: Insufficient documentation

## 2020-08-29 DIAGNOSIS — Z20822 Contact with and (suspected) exposure to covid-19: Secondary | ICD-10-CM | POA: Diagnosis not present

## 2021-01-26 ENCOUNTER — Ambulatory Visit: Payer: 59

## 2021-01-26 ENCOUNTER — Ambulatory Visit (INDEPENDENT_AMBULATORY_CARE_PROVIDER_SITE_OTHER): Payer: 59 | Admitting: Orthopedic Surgery

## 2021-01-26 ENCOUNTER — Other Ambulatory Visit: Payer: Self-pay

## 2021-01-26 ENCOUNTER — Encounter: Payer: Self-pay | Admitting: Orthopedic Surgery

## 2021-01-26 VITALS — BP 139/92 | HR 69 | Ht 64.0 in | Wt 213.0 lb

## 2021-01-26 DIAGNOSIS — M25561 Pain in right knee: Secondary | ICD-10-CM | POA: Diagnosis not present

## 2021-01-26 DIAGNOSIS — M25562 Pain in left knee: Secondary | ICD-10-CM | POA: Diagnosis not present

## 2021-01-26 DIAGNOSIS — G8929 Other chronic pain: Secondary | ICD-10-CM

## 2021-01-26 NOTE — Progress Notes (Signed)
Chief Complaint  Patient presents with  . Knee Pain    Lt knee pain on and off for years     65 year old female with bilateral knee pain on and off for years left is now worse than right  She is headed to the [facility]  She is concerned about the intermittent pain in both knees  Both knees continue to have normal range of motion with slight effusions There is no restriction of motion or instability patient is walking without support  X-rays show bilateral arthritis left worse than right with medial compartment narrowing secondary bone changes are minimal  Encounter Diagnosis  Name Primary?  . Chronic pain of both knees Yes    Procedure note for bilateral knee injections  Procedure note left knee injection verbal consent was obtained to inject left knee joint  Timeout was completed to confirm the site of injection  The medications used were 6 mg Celestone with Sensorcaine Anesthesia was provided by ethyl chloride and the skin was prepped with alcohol.  After cleaning the skin with alcohol a 20-gauge needle was used to inject the left knee joint. There were no complications. A sterile bandage was applied.   Procedure note right knee injection verbal consent was obtained to inject right knee joint  Timeout was completed to confirm the site of injection  The medications used were 6 mg Celestone with Sensorcaine Anesthesia was provided by ethyl chloride and the skin was prepped with alcohol.  After cleaning the skin with alcohol a 20-gauge needle was used to inject the right knee joint. There were no complications. A sterile bandage was applied.  Follow-up as needed

## 2021-01-28 ENCOUNTER — Other Ambulatory Visit (HOSPITAL_COMMUNITY): Payer: Self-pay

## 2021-02-07 ENCOUNTER — Other Ambulatory Visit (HOSPITAL_COMMUNITY): Payer: Self-pay

## 2021-02-10 ENCOUNTER — Other Ambulatory Visit (HOSPITAL_COMMUNITY): Payer: Self-pay

## 2021-07-27 ENCOUNTER — Encounter: Payer: Self-pay | Admitting: Gastroenterology

## 2021-08-04 ENCOUNTER — Other Ambulatory Visit (HOSPITAL_COMMUNITY): Payer: Self-pay | Admitting: Family Medicine

## 2021-08-04 DIAGNOSIS — Z1231 Encounter for screening mammogram for malignant neoplasm of breast: Secondary | ICD-10-CM

## 2021-08-09 ENCOUNTER — Ambulatory Visit (HOSPITAL_COMMUNITY)
Admission: RE | Admit: 2021-08-09 | Discharge: 2021-08-09 | Disposition: A | Discharge status: Home / Self Care | Payer: 59 | Source: Ambulatory Visit | Attending: Family Medicine | Admitting: Family Medicine

## 2021-08-09 ENCOUNTER — Other Ambulatory Visit: Payer: Self-pay

## 2021-08-09 DIAGNOSIS — Z1231 Encounter for screening mammogram for malignant neoplasm of breast: Secondary | ICD-10-CM | POA: Diagnosis present

## 2022-02-08 ENCOUNTER — Encounter: Payer: Self-pay | Admitting: Gastroenterology

## 2022-02-14 ENCOUNTER — Other Ambulatory Visit: Payer: Self-pay

## 2022-02-14 ENCOUNTER — Ambulatory Visit (AMBULATORY_SURGERY_CENTER): Payer: PPO | Admitting: *Deleted

## 2022-02-14 VITALS — Ht 64.0 in | Wt 213.0 lb

## 2022-02-14 DIAGNOSIS — Z86010 Personal history of colonic polyps: Secondary | ICD-10-CM

## 2022-02-14 DIAGNOSIS — Z800 Family history of malignant neoplasm of digestive organs: Secondary | ICD-10-CM

## 2022-02-14 MED ORDER — PLENVU 140 G PO SOLR: 1.0000 | Freq: Once | ORAL | 0 refills | Status: AC

## 2022-02-14 NOTE — Progress Notes (Signed)
Pt's previsit is done over the phone and prep instructions sent via [facility].  Pt's name and DOB verified at the beginning of the previsit.  Pt denies any difficulty with ambulating.  ? ? ?No trouble with anesthesia (Propofol), denies trouble moving neck, or hx/fam hx of malignant hyperthermia per pt ? ? ?No egg or soy allergy ? ?No home oxygen use  ? ?No medications for weight loss taken ? ?Pt denies constipation issues ? ?Pt informed that we do not do prior authorizations for prep ? ?[employer] Medicare coupon sent via mail- pt is aware to pick up prep after she receives coupon ? ?

## 2022-03-07 ENCOUNTER — Encounter: Payer: Self-pay | Admitting: Gastroenterology

## 2022-03-07 ENCOUNTER — Ambulatory Visit (AMBULATORY_SURGERY_CENTER): Payer: PPO | Admitting: Gastroenterology

## 2022-03-07 VITALS — BP 103/62 | HR 62 | Temp 98.6°F | Resp 20 | Ht 63.0 in | Wt 213.0 lb

## 2022-03-07 DIAGNOSIS — K635 Polyp of colon: Secondary | ICD-10-CM

## 2022-03-07 DIAGNOSIS — D123 Benign neoplasm of transverse colon: Secondary | ICD-10-CM | POA: Diagnosis not present

## 2022-03-07 DIAGNOSIS — Z86010 Personal history of colonic polyps: Secondary | ICD-10-CM | POA: Diagnosis not present

## 2022-03-07 MED ORDER — SODIUM CHLORIDE 0.9 % IV SOLN: 500.0000 mL | Freq: Once | INTRAVENOUS | Status: DC

## 2022-03-07 NOTE — Progress Notes (Signed)
Report to PACU, RN, vss, BBS= Clear.  

## 2022-03-07 NOTE — Progress Notes (Signed)
Pt's states no medical or surgical changes since previsit or office visit. 

## 2022-03-07 NOTE — Progress Notes (Signed)
Called to room to assist during endoscopic procedure.  Patient ID and intended procedure confirmed with present staff. Received instructions for my participation in the procedure from the performing physician.  

## 2022-03-07 NOTE — Progress Notes (Signed)
[facility] History and Physical ? ? ?Primary Care Physician:  Leslie Andrea, MD ? ? ?Reason for Procedure:  History of adenomatous colon polyps ? ?Plan:    Surveillance colonoscopy with possible interventions as needed ? ? ? ? ?HPI: Amy Patel is a very pleasant 66 y.o. female here for surveillance colonoscopy. ?Denies any nausea, vomiting, abdominal pain, melena or bright red blood per rectum ? ?The risks and benefits as well as alternatives of endoscopic procedure(s) have been discussed and reviewed. All questions answered. The patient agrees to proceed. ? ? ? ?Past Medical History:  ?Diagnosis Date  ? Allergy   ? Anxiety   ? Arthritis   ? Depression   ? GERD (gastroesophageal reflux disease)   ? Hyperlipidemia   ? Hypertension   ? MVP (mitral valve prolapse)   ? Reflux   ? ? ?Past Surgical History:  ?Procedure Laterality Date  ? COLONOSCOPY    ? ? ?Prior to Admission medications   ?Medication Sig Start Date End Date Taking? Authorizing Provider  ?ASPIRIN 81 PO Take by mouth. Takes 2 tablets daily   Yes [provider]  ?Bacillus Coagulans-Inulin (ALIGN PREBIOTIC-PROBIOTIC PO) Take by mouth daily.   Yes [provider]  ?clonazePAM (KLONOPIN) 1 MG tablet Take 1 mg by mouth as needed for anxiety.   Yes [provider]  ?escitalopram (LEXAPRO) 20 MG tablet Take 20 mg by mouth daily. 01/23/21  Yes [provider]  ?nebivolol (BYSTOLIC) 10 MG tablet Take 10 mg by mouth at bedtime. 01/23/21  Yes [provider]  ?olmesartan (BENICAR) 20 MG tablet Take 20 mg by mouth every morning. 11/30/21  Yes [provider]  ?Azilsartan Medoxomil 40 MG TABS TAKE 1 TABLET BY MOUTH ONCE DAILY FOR HYPERTENSION. 04/15/20 04/15/21  Lemmie Evens, MD  ?Azilsartan Medoxomil 80 MG TABS Take by mouth. ?Patient not taking: Reported on 01/26/2021    [provider]  ?EDARBI 40 MG TABS Take by mouth. 01/19/21   [provider]  ?escitalopram (LEXAPRO) 20  MG tablet TAKE 1 TABLET BY MOUTH ONCE DAILY FOR DEPRESSION. 04/15/20 04/15/21  Lemmie Evens, MD  ?Multiple Vitamin (MULTIVITAMIN) tablet Take 1 tablet by mouth daily.    [provider]  ?nebivolol (BYSTOLIC) 10 MG tablet TAKE 1 TABLET BY MOUTH ONCE DAILY FOR HYPERTENSION. 04/15/20 04/15/21  Lemmie Evens, MD  ?OVER THE COUNTER MEDICATION Bi Flex one tablet daily.    [provider]  ?OVER THE COUNTER MEDICATION 1,500 mg daily. Tumeric, 1000 mg daily.    [provider]  ?rosuvastatin (CRESTOR) 10 MG tablet Take 10 mg by mouth daily.    [provider]  ? ? ?Current Outpatient Medications  ?Medication Sig Dispense Refill  ? ASPIRIN 81 PO Take by mouth. Takes 2 tablets daily    ? Bacillus Coagulans-Inulin (ALIGN PREBIOTIC-PROBIOTIC PO) Take by mouth daily.    ? clonazePAM (KLONOPIN) 1 MG tablet Take 1 mg by mouth as needed for anxiety.    ? escitalopram (LEXAPRO) 20 MG tablet Take 20 mg by mouth daily.    ? nebivolol (BYSTOLIC) 10 MG tablet Take 10 mg by mouth at bedtime.    ? olmesartan (BENICAR) 20 MG tablet Take 20 mg by mouth every morning.    ? Azilsartan Medoxomil 40 MG TABS TAKE 1 TABLET BY MOUTH ONCE DAILY FOR HYPERTENSION. 90 tablet 3  ? Azilsartan Medoxomil 80 MG TABS Take by mouth. (Patient not taking: Reported on 01/26/2021)    ? EDARBI 40  MG TABS Take by mouth.    ? escitalopram (LEXAPRO) 20 MG tablet TAKE 1 TABLET BY MOUTH ONCE DAILY FOR DEPRESSION. 90 tablet 3  ? Multiple Vitamin (MULTIVITAMIN) tablet Take 1 tablet by mouth daily.    ? nebivolol (BYSTOLIC) 10 MG tablet TAKE 1 TABLET BY MOUTH ONCE DAILY FOR HYPERTENSION. 90 tablet 3  ? OVER THE COUNTER MEDICATION Bi Flex one tablet daily.    ? OVER THE COUNTER MEDICATION 1,500 mg daily. Tumeric, 1000 mg daily.    ? rosuvastatin (CRESTOR) 10 MG tablet Take 10 mg by mouth daily.    ? ?Current Facility-Administered Medications  ?Medication Dose Route Frequency Provider Last Rate Last Admin  ? 0.9 %  sodium chloride  infusion  500 mL Intravenous Once Ahnesty Finfrock, Venia Minks, MD      ? ? ?Allergies as of 03/07/2022 - Review Complete 03/07/2022  ?Allergen Reaction Noted  ? Penicillins  04/23/2012  ? Reglan [metoclopramide] Anxiety 05/21/2018  ? ? ?Family History  ?Problem Relation Age of Onset  ? Heart disease Unknown   ? Arthritis Unknown   ? Cancer Unknown   ? Colon cancer Father   ? Esophageal cancer Neg Hx   ? Rectal cancer Neg Hx   ? Stomach cancer Neg Hx   ? ? ?Social History  ? ?Socioeconomic History  ? Marital status: Married  ?  Spouse name: Not on file  ? Number of children: Not on file  ? Years of education: Not on file  ? Highest education level: Not on file  ?Occupational History  ? Occupation: nurse  ?  Employer: [employer]  ?Tobacco Use  ? Smoking status: Former  ? Smokeless tobacco: Never  ? Tobacco comments:  ?  Quit 11 years ago.  ?Vaping Use  ? Vaping Use: Never used  ?Substance and Sexual Activity  ? Alcohol use: Yes  ?  Comment: daily wine  ? Drug use: No  ? Sexual activity: Not on file  ?Other Topics Concern  ? Not on file  ?Social History Narrative  ? Not on file  ? ?Social Determinants of Health  ? ?Financial Resource Strain: Not on file  ?Food Insecurity: Not on file  ?Transportation Needs: Not on file  ?Physical Activity: Not on file  ?Stress: Not on file  ?Social Connections: Not on file  ?Intimate Partner Violence: Not on file  ? ? ?Review of Systems: ? ?All other review of systems negative except as mentioned in the HPI. ? ?Physical Exam: ?Vital signs in last 24 hours: ?BP (!) 141/76   Pulse 80   Temp 98.6 ?F (37 ?C)   Ht '5\' 3"'$  (1.6 m)   Wt 213 lb (96.6 kg)   SpO2 97%   BMI 37.73 kg/m?  ?General:   Alert, NAD ?Lungs:  Clear .   ?Heart:  Regular rate and rhythm ?Abdomen:  Soft, nontender and nondistended. ?Neuro/Psych:  Alert and cooperative. Normal mood and affect. A and O x 3 ? ?Reviewed labs, radiology imaging, old records and pertinent past GI work up ? ?Patient is appropriate for  planned procedure(s) and anesthesia in an ambulatory setting ? ? ?K. Denzil Magnuson , MD ?[id number]  ? ? ?  ?

## 2022-03-07 NOTE — Op Note (Signed)
[place] ?Patient Name: Amy Patel ?Procedure Date: 03/07/2022 3:28 PM ?MRN: [id number] ?Endoscopist: Mauri Pole , MD ?Age: 66 ?Referring MD:  ?Date of Birth: [birth year] ?Gender: Female ?Account #: [id number] ?Procedure:                Colonoscopy ?Indications:              High risk colon cancer surveillance: Personal  ?                          history of colonic polyps ?Medicines:                Monitored Anesthesia Care ?Procedure:                After obtaining informed consent, the colonoscope  ?                          was passed under direct vision. Throughout the  ?                          procedure, the patient's blood pressure, pulse, and  ?                          oxygen saturations were monitored continuously. The  ?                          PCF-HQ190L Colonoscope was introduced through the  ?                          anus and advanced to the the cecum, identified by  ?                          appendiceal orifice and ileocecal valve. The  ?                          colonoscopy was performed without difficulty. The  ?                          patient tolerated the procedure well. The quality  ?                          of the bowel preparation was good. The ileocecal  ?                          valve, appendiceal orifice, and rectum were  ?                          photographed. ?Scope In: 3:36:05 PM ?Scope Out: 3:53:19 PM ?Scope Withdrawal Time: 0 hours 11 minutes 21 seconds  ?Total Procedure Duration: 0 hours 17 minutes 14 seconds  ?Findings:                 The perianal and digital rectal examinations were  ?                          normal. ?  A less than 1 mm polyp was found in the transverse  ?                          colon. The polyp was sessile. The polyp was removed  ?                          with a cold biopsy forceps. Resection and retrieval  ?                          were complete. ?                          Four sessile polyps were  found in the transverse  ?                          colon. The polyps were 4 to 6 mm in size. These  ?                          polyps were removed with a cold snare. Resection  ?                          and retrieval were complete. ?                          Scattered small and large-mouthed diverticula were  ?                          found in the sigmoid colon, descending colon,  ?                          transverse colon and ascending colon. ?                          Non-bleeding external and internal hemorrhoids were  ?                          found during retroflexion. The hemorrhoids were  ?                          medium-sized. ?Complications:            No immediate complications. ?Estimated Blood Loss:     Estimated blood loss was minimal. ?Impression:               - One less than 1 mm polyp in the transverse colon,  ?                          removed with a cold biopsy forceps. Resected and  ?                          retrieved. ?                          - Four 4 to 6 mm polyps in the transverse colon,  ?  removed with a cold snare. Resected and retrieved. ?                          - Moderate diverticulosis in the sigmoid colon, in  ?                          the descending colon, in the transverse colon and  ?                          in the ascending colon. ?                          - Non-bleeding external and internal hemorrhoids. ?Recommendation:           - Patient has a contact number available for  ?                          emergencies. The signs and symptoms of potential  ?                          delayed complications were discussed with the  ?                          patient. Return to normal activities tomorrow.  ?                          Written discharge instructions were provided to the  ?                          patient. ?                          - Resume previous diet. ?                          - Continue present medications. ?                           - Await pathology results. ?                          - Repeat colonoscopy in 3 - 5 years for  ?                          surveillance based on pathology results. ?Mauri Pole, MD ?03/07/2022 4:02:27 PM ?This report has been signed electronically. ?

## 2022-03-07 NOTE — Patient Instructions (Signed)
YOU HAD AN ENDOSCOPIC PROCEDURE TODAY AT THE [facility]:   Refer to the procedure report that was given to you for any specific questions about what was found during the examination.  If the procedure report does not answer your questions, please call your gastroenterologist to clarify.  If you requested that your care partner not be given the details of your procedure findings, then the procedure report has been included in a sealed envelope for you to review at your convenience later.  YOU SHOULD EXPECT: Some feelings of bloating in the abdomen. Passage of more gas than usual.  Walking can help get rid of the air that was put into your GI tract during the procedure and reduce the bloating. If you had a lower endoscopy (such as a colonoscopy or flexible sigmoidoscopy) you may notice spotting of blood in your stool or on the toilet paper. If you underwent a bowel prep for your procedure, you may not have a normal bowel movement for a few days.  Please Note:  You might notice some irritation and congestion in your nose or some drainage.  This is from the oxygen used during your procedure.  There is no need for concern and it should clear up in a day or so.  SYMPTOMS TO REPORT IMMEDIATELY:   Following lower endoscopy (colonoscopy or flexible sigmoidoscopy):  Excessive amounts of blood in the stool  Significant tenderness or worsening of abdominal pains  Swelling of the abdomen that is new, acute  Fever of 100F or higher   Following upper endoscopy (EGD)  Vomiting of blood or coffee ground material  New chest pain or pain under the shoulder blades  Painful or persistently difficult swallowing  New shortness of breath  Fever of 100F or higher  Black, tarry-looking stools  For urgent or emergent issues, a gastroenterologist can be reached at any hour by calling [phone]. Do not use MyChart messaging for urgent concerns.    DIET:  We do recommend a small meal at first, but  then you may proceed to your regular diet.  Drink plenty of fluids but you should avoid alcoholic beverages for 24 hours.  ACTIVITY:  You should plan to take it easy for the rest of today and you should NOT DRIVE or use heavy machinery until tomorrow (because of the sedation medicines used during the test).    FOLLOW UP: Our staff will call the number listed on your records 48-72 hours following your procedure to check on you and address any questions or concerns that you may have regarding the information given to you following your procedure. If we do not reach you, we will leave a message.  We will attempt to reach you two times.  During this call, we will ask if you have developed any symptoms of COVID 19. If you develop any symptoms (ie: fever, flu-like symptoms, shortness of breath, cough etc.) before then, please call [phone].  If you test positive for Covid 19 in the 2 weeks post procedure, please call and report this information to us.    If any biopsies were taken you will be contacted by phone or by letter within the next 1-3 weeks.  Please call us at [phone] if you have not heard about the biopsies in 3 weeks.    SIGNATURES/CONFIDENTIALITY: You and/or your care partner have signed paperwork which will be entered into your electronic medical record.  These signatures attest to the fact that that the information above on   your After Visit Summary has been reviewed and is understood.  Full responsibility of the confidentiality of this discharge information lies with you and/or your care-partner. 

## 2022-03-09 ENCOUNTER — Telehealth: Payer: Self-pay

## 2022-03-09 NOTE — Telephone Encounter (Signed)
?  Follow up Call- ? ? ?  03/07/2022  ?  3:20 PM  ?Call back number  ?Post procedure Call Back phone  # [phone]  ?Permission to leave phone message Yes  ?  ? ?Patient questions: ? ?Do you have a fever, pain , or abdominal swelling? No. ?Pain Score  0 * ? ?Have you tolerated food without any problems? Yes.   ? ?Have you been able to return to your normal activities? Yes.   ? ?Do you have any questions about your discharge instructions: ?Diet   No. ?Medications  No. ?Follow up visit  No. ? ?Do you have questions or concerns about your Care? No. ? ?Actions: ?* If pain score is 4 or above: ?No action needed, pain <4. ? ? ?

## 2022-03-20 ENCOUNTER — Encounter: Payer: Self-pay | Admitting: Gastroenterology

## 2022-03-27 IMAGING — MG MM DIGITAL SCREENING BILAT W/ TOMO AND CAD
8 series · 8 of 24 positions shown · non-contrast
Comparison: Previous exam(s).

CLINICAL DATA: Screening.

EXAM:
DIGITAL SCREENING BILATERAL MAMMOGRAM WITH TOMOSYNTHESIS AND CAD
TECHNIQUE: Bilateral screening digital craniocaudal and mediolateral oblique
mammograms were obtained. Bilateral screening digital breast
tomosynthesis was performed. The images were evaluated with
computer-aided detection.

[R MLO synth-2D]
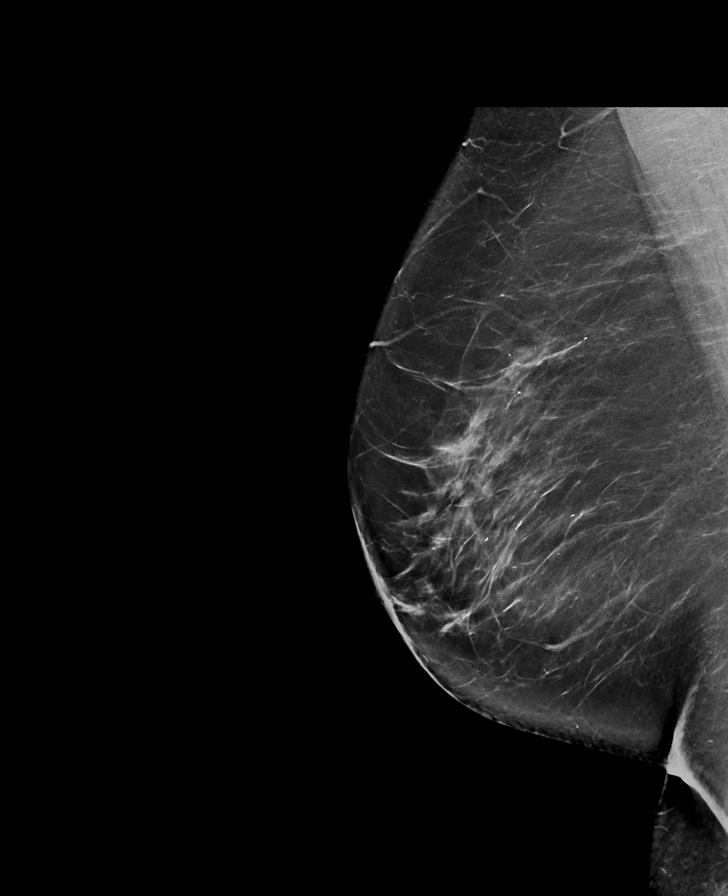

[L CC synth-2D]
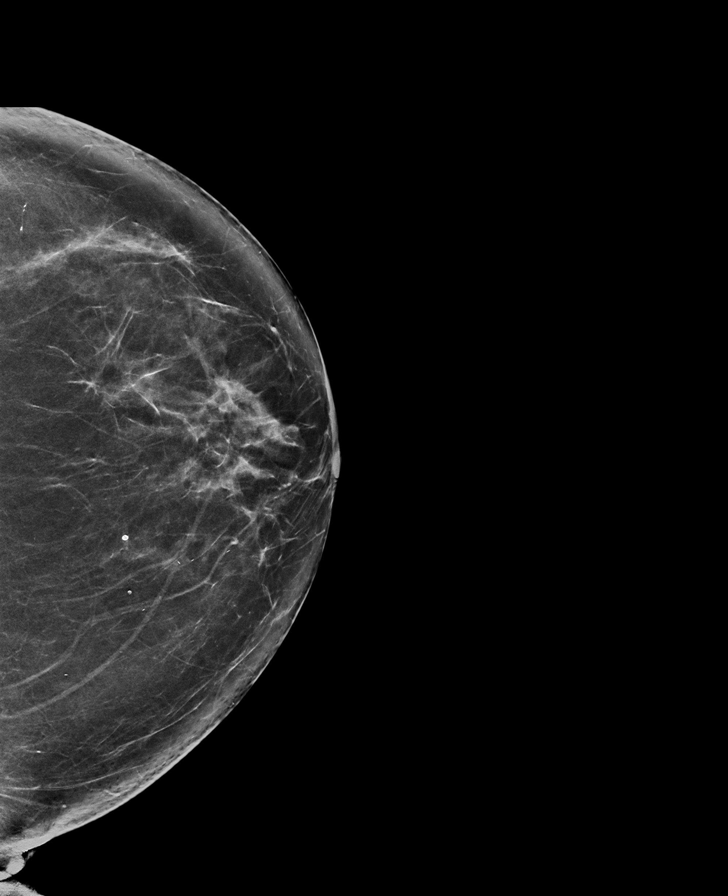

[R CC synth-2D]
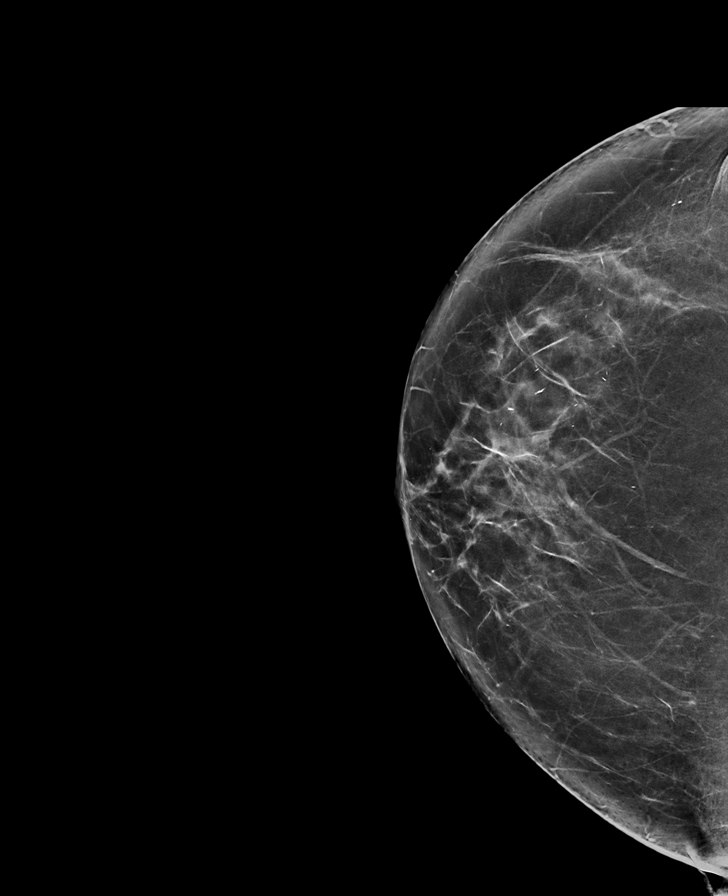

[L MLO synth-2D]
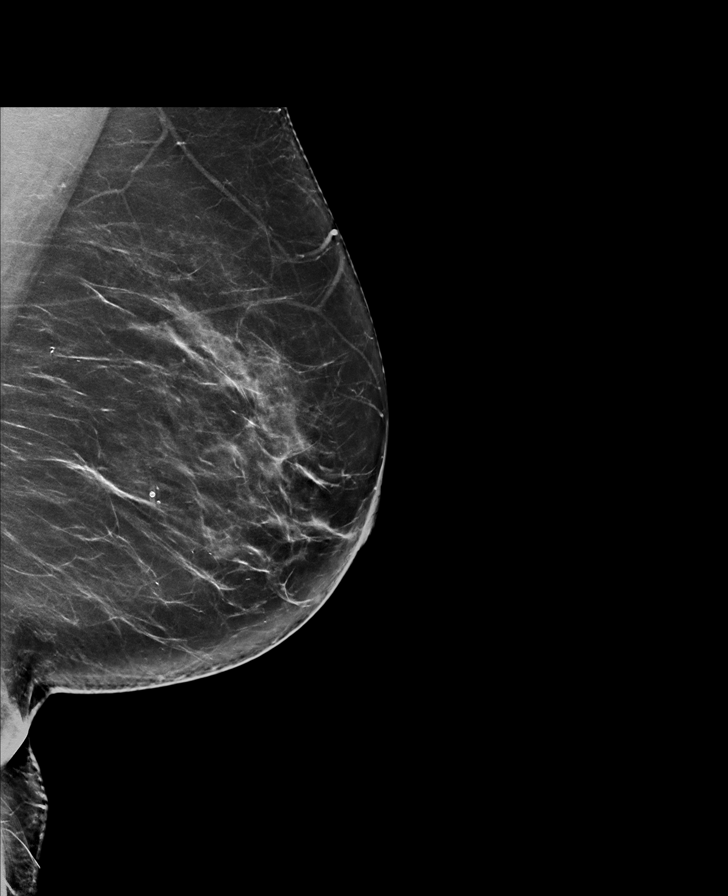

[R MLO tomo · tomo slice 47/93.0]
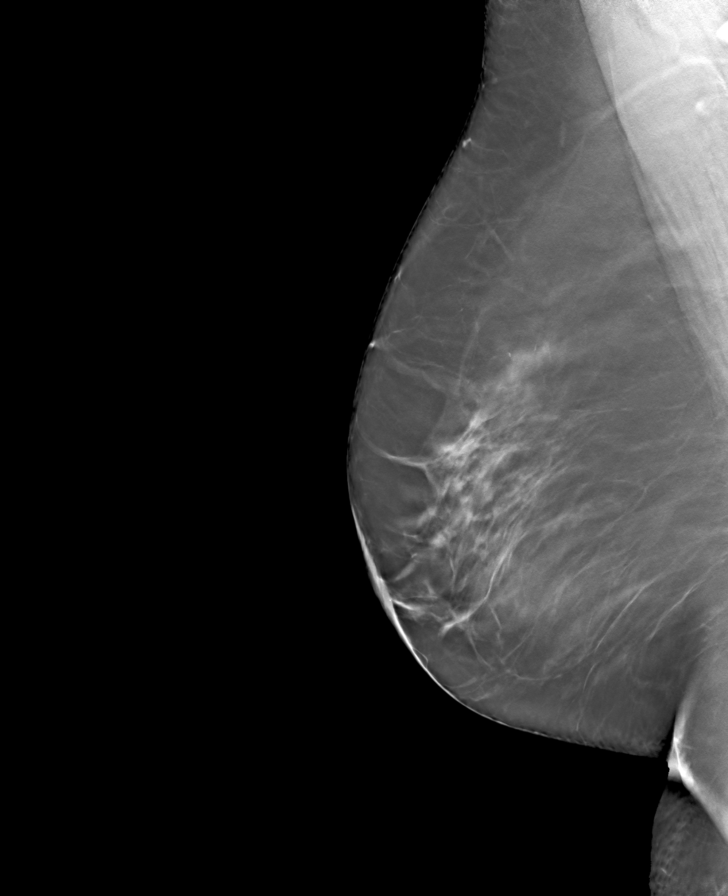

[L CC tomo · tomo slice 39/78.0]
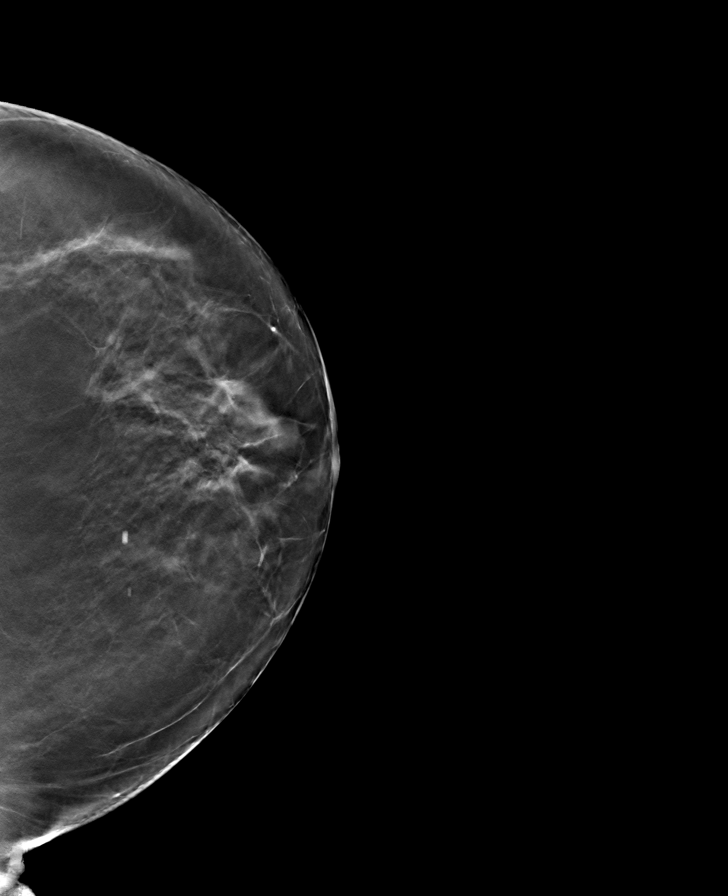

[L MLO tomo · tomo slice 43/85.0]
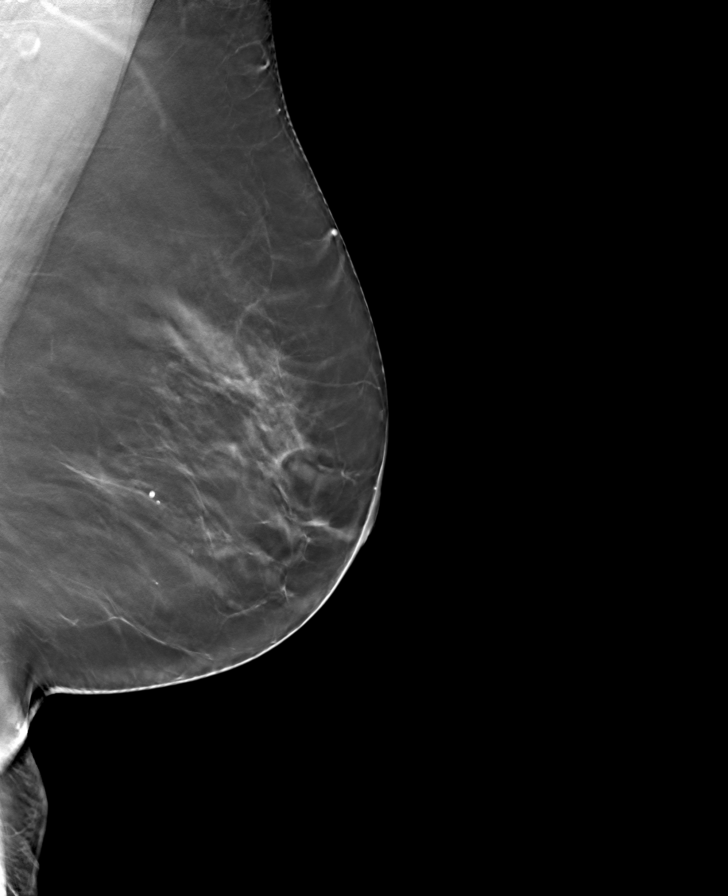

[R CC tomo · tomo slice 39/78.0]
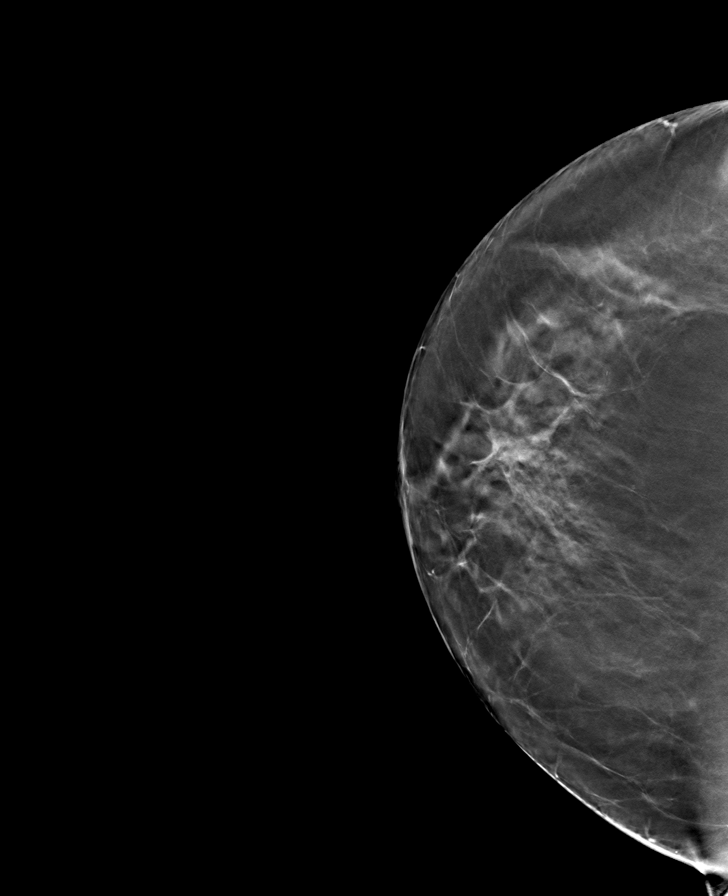

[8 of 24 positions shown; findings below may reference images not displayed]

ACR Breast Density Category c: The breast tissue is heterogeneously
dense, which may obscure small masses.
FINDINGS: There are no findings suspicious for malignancy.
IMPRESSION: No mammographic evidence of malignancy. A result letter of this
screening mammogram will be mailed directly to the patient.

RECOMMENDATION:
Screening mammogram in one year. (Code:Q3-W-BC3)

BI-RADS CATEGORY  1: Negative.

## 2022-04-09 ENCOUNTER — Other Ambulatory Visit (HOSPITAL_COMMUNITY): Payer: Self-pay

## 2022-04-09 MED ORDER — DOXYCYCLINE HYCLATE 100 MG PO TABS
100.0000 mg | ORAL_TABLET | Freq: Two times a day (BID) | ORAL | 0 refills | Status: DC
  Filled 2022-04-09: qty 20, 10d supply, fill #0

## 2022-04-09 MED ORDER — OLMESARTAN MEDOXOMIL 20 MG PO TABS
20.0000 mg | ORAL_TABLET | Freq: Every morning | ORAL | 3 refills | Status: DC
  Filled 2022-04-09: qty 90, 90d supply, fill #0

## 2022-04-09 MED ORDER — CLONAZEPAM 1 MG PO TABS
0.5000 mg | ORAL_TABLET | Freq: Two times a day (BID) | ORAL | 1 refills | Status: DC | PRN
  Filled 2022-04-09: qty 180, 90d supply, fill #0

## 2022-04-09 MED ORDER — ESCITALOPRAM OXALATE 20 MG PO TABS
20.0000 mg | ORAL_TABLET | Freq: Every day | ORAL | 3 refills | Status: DC
  Filled 2022-04-09: qty 90, 90d supply, fill #0

## 2022-04-09 MED ORDER — NEBIVOLOL HCL 10 MG PO TABS
10.0000 mg | ORAL_TABLET | Freq: Every day | ORAL | 3 refills | Status: DC
  Filled 2022-04-09: qty 90, 90d supply, fill #0

## 2022-04-09 MED ORDER — AZITHROMYCIN 250 MG PO TABS
ORAL_TABLET | ORAL | 0 refills | Status: DC
  Filled 2022-04-09: qty 6, 5d supply, fill #0

## 2022-04-10 ENCOUNTER — Other Ambulatory Visit (HOSPITAL_COMMUNITY): Payer: Self-pay

## 2022-04-11 ENCOUNTER — Other Ambulatory Visit (HOSPITAL_COMMUNITY): Payer: Self-pay

## 2022-04-11 MED ORDER — CLONAZEPAM 1 MG PO TABS
0.5000 mg | ORAL_TABLET | Freq: Two times a day (BID) | ORAL | 1 refills | Status: AC | PRN
  Filled 2022-04-11: qty 180, 90d supply, fill #0

## 2022-04-11 MED ORDER — DOXYCYCLINE HYCLATE 100 MG PO TABS
100.0000 mg | ORAL_TABLET | Freq: Two times a day (BID) | ORAL | 0 refills | Status: DC
  Filled 2022-04-11: qty 20, 10d supply, fill #0

## 2022-04-11 MED ORDER — AZITHROMYCIN 250 MG PO TABS
ORAL_TABLET | ORAL | 0 refills | Status: DC
  Filled 2022-04-11: qty 6, 5d supply, fill #0

## 2022-04-11 MED ORDER — NEBIVOLOL HCL 10 MG PO TABS
10.0000 mg | ORAL_TABLET | Freq: Every day | ORAL | 3 refills | Status: DC
  Filled 2022-04-11 – 2022-04-23 (×5): qty 90, 90d supply, fill #0
  Filled 2022-08-09: qty 90, 90d supply, fill #1
  Filled 2022-11-19: qty 90, 90d supply, fill #2
  Filled 2023-02-08: qty 90, 90d supply, fill #3

## 2022-04-11 MED ORDER — OLMESARTAN MEDOXOMIL 20 MG PO TABS
20.0000 mg | ORAL_TABLET | Freq: Every morning | ORAL | 3 refills | Status: DC
  Filled 2022-04-11 – 2022-04-17 (×6): qty 90, 90d supply, fill #0
  Filled 2022-08-09: qty 90, 90d supply, fill #1
  Filled 2022-11-19: qty 90, 90d supply, fill #2
  Filled 2023-02-22: qty 90, 90d supply, fill #3

## 2022-04-11 MED ORDER — ESCITALOPRAM OXALATE 20 MG PO TABS
20.0000 mg | ORAL_TABLET | Freq: Every day | ORAL | 3 refills | Status: AC
  Filled 2022-04-11: qty 90, 90d supply, fill #0
  Filled 2022-04-13 – 2022-04-16 (×2): qty 90, 90d supply, fill #1
  Filled 2022-04-17: qty 30, 30d supply, fill #1
  Filled 2022-08-09: qty 30, 30d supply, fill #2
  Filled 2022-09-10: qty 30, 30d supply, fill #3
  Filled 2022-10-14: qty 30, 30d supply, fill #4
  Filled 2022-11-19: qty 30, 30d supply, fill #5
  Filled 2023-01-01: qty 30, 30d supply, fill #6
  Filled 2023-02-08: qty 30, 30d supply, fill #7
  Filled 2023-03-12: qty 30, 30d supply, fill #8
  Filled 2023-04-11: qty 30, 30d supply, fill #9

## 2022-04-13 ENCOUNTER — Other Ambulatory Visit (HOSPITAL_COMMUNITY): Payer: Self-pay

## 2022-04-16 ENCOUNTER — Other Ambulatory Visit (HOSPITAL_COMMUNITY): Payer: Self-pay

## 2022-04-16 ENCOUNTER — Ambulatory Visit (INDEPENDENT_AMBULATORY_CARE_PROVIDER_SITE_OTHER): Payer: PPO | Admitting: Orthopedic Surgery

## 2022-04-16 DIAGNOSIS — M1712 Unilateral primary osteoarthritis, left knee: Secondary | ICD-10-CM

## 2022-04-16 DIAGNOSIS — M25562 Pain in left knee: Secondary | ICD-10-CM | POA: Diagnosis not present

## 2022-04-16 DIAGNOSIS — G8929 Other chronic pain: Secondary | ICD-10-CM

## 2022-04-16 NOTE — Patient Instructions (Signed)

## 2022-04-16 NOTE — Progress Notes (Signed)
FOLLOW UP   Encounter Diagnosis  Name Primary?   Chronic pain of left knee Yes     Chief Complaint  Patient presents with   Knee Pain    LT knee Request injection   Injections    LT knee   Encounter Diagnoses  Name Primary?   Chronic pain of left knee Yes   Unilateral primary osteoarthritis, left knee      66 year old female presents with chronic left knee pain request left knee injection prior to going to [place] and [place]  The knee is amenable to injection.  Lateral approach was used.  See dictation below  Follow-up as needed  Patient will be evaluated for possible hyaluronic acid injections as well  Procedure note left knee injection   verbal consent was obtained to inject left knee joint  Timeout was completed to confirm the site of injection  The medications used were depomedrol 40 mg and 1% lidocaine 3 cc Anesthesia was provided by ethyl chloride and the skin was prepped with alcohol.  After cleaning the skin with alcohol a 20-gauge needle was used to inject the left knee joint. There were no complications. A sterile bandage was applied.

## 2022-04-17 ENCOUNTER — Other Ambulatory Visit (HOSPITAL_COMMUNITY): Payer: Self-pay

## 2022-04-17 MED ORDER — ROSUVASTATIN CALCIUM 40 MG PO TABS
40.0000 mg | ORAL_TABLET | Freq: Every day | ORAL | 3 refills | Status: DC
  Filled 2022-04-17: qty 30, 30d supply, fill #0
  Filled 2022-04-17: qty 90, 90d supply, fill #0
  Filled 2022-04-25: qty 30, 30d supply, fill #1
  Filled 2022-08-09: qty 30, 30d supply, fill #2
  Filled 2022-09-10: qty 30, 30d supply, fill #3
  Filled 2022-10-14: qty 30, 30d supply, fill #4

## 2022-04-18 ENCOUNTER — Other Ambulatory Visit (HOSPITAL_COMMUNITY): Payer: Self-pay

## 2022-04-19 ENCOUNTER — Other Ambulatory Visit (HOSPITAL_COMMUNITY): Payer: Self-pay

## 2022-04-23 ENCOUNTER — Other Ambulatory Visit (HOSPITAL_COMMUNITY): Payer: Self-pay

## 2022-04-24 ENCOUNTER — Other Ambulatory Visit (HOSPITAL_COMMUNITY): Payer: Self-pay

## 2022-04-25 ENCOUNTER — Other Ambulatory Visit (HOSPITAL_COMMUNITY): Payer: Self-pay

## 2022-04-25 MED ORDER — CIPROFLOXACIN HCL 500 MG PO TABS
500.0000 mg | ORAL_TABLET | Freq: Two times a day (BID) | ORAL | 0 refills | Status: AC
  Filled 2022-04-25: qty 14, 7d supply, fill #0

## 2022-04-26 ENCOUNTER — Other Ambulatory Visit (HOSPITAL_COMMUNITY): Payer: Self-pay

## 2022-04-30 ENCOUNTER — Other Ambulatory Visit (HOSPITAL_COMMUNITY): Payer: Self-pay

## 2022-08-09 ENCOUNTER — Other Ambulatory Visit (HOSPITAL_COMMUNITY): Payer: Self-pay

## 2022-08-13 ENCOUNTER — Other Ambulatory Visit (HOSPITAL_COMMUNITY): Payer: Self-pay | Admitting: Family Medicine

## 2022-08-13 ENCOUNTER — Other Ambulatory Visit (HOSPITAL_COMMUNITY): Payer: Self-pay

## 2022-08-13 DIAGNOSIS — Z1231 Encounter for screening mammogram for malignant neoplasm of breast: Secondary | ICD-10-CM

## 2022-08-13 MED ORDER — CIPROFLOXACIN HCL 500 MG PO TABS
500.0000 mg | ORAL_TABLET | Freq: Two times a day (BID) | ORAL | 2 refills | Status: DC
Start: 2022-08-13 — End: 2023-03-14
  Filled 2022-08-13: qty 20, 10d supply, fill #0
  Filled 2022-09-10: qty 20, 10d supply, fill #1
  Filled 2022-10-14: qty 20, 10d supply, fill #2

## 2022-08-14 ENCOUNTER — Other Ambulatory Visit (HOSPITAL_COMMUNITY): Payer: Self-pay

## 2022-08-20 ENCOUNTER — Ambulatory Visit (HOSPITAL_COMMUNITY)
Admission: RE | Admit: 2022-08-20 | Discharge: 2022-08-20 | Disposition: A | Discharge status: Home / Self Care | Payer: PPO | Source: Ambulatory Visit | Attending: Family Medicine | Admitting: Family Medicine

## 2022-08-20 DIAGNOSIS — Z1231 Encounter for screening mammogram for malignant neoplasm of breast: Secondary | ICD-10-CM | POA: Diagnosis present

## 2022-09-10 ENCOUNTER — Other Ambulatory Visit (HOSPITAL_COMMUNITY): Payer: Self-pay

## 2022-11-19 ENCOUNTER — Other Ambulatory Visit (HOSPITAL_COMMUNITY): Payer: Self-pay

## 2022-11-19 MED ORDER — ATORVASTATIN CALCIUM 80 MG PO TABS
80.0000 mg | ORAL_TABLET | Freq: Every day | ORAL | 3 refills | Status: AC
  Filled 2022-11-19: qty 90, 90d supply, fill #0
  Filled 2023-02-22: qty 90, 90d supply, fill #1

## 2022-12-27 ENCOUNTER — Encounter: Payer: Self-pay | Admitting: Radiology

## 2023-02-08 ENCOUNTER — Other Ambulatory Visit (HOSPITAL_COMMUNITY): Payer: Self-pay

## 2023-02-25 ENCOUNTER — Other Ambulatory Visit (HOSPITAL_COMMUNITY): Payer: Self-pay

## 2023-03-06 ENCOUNTER — Other Ambulatory Visit: Payer: Self-pay | Admitting: Orthopedic Surgery

## 2023-03-06 DIAGNOSIS — M170 Bilateral primary osteoarthritis of knee: Secondary | ICD-10-CM

## 2023-03-07 ENCOUNTER — Ambulatory Visit: Payer: PPO | Admitting: Orthopedic Surgery

## 2023-03-07 ENCOUNTER — Encounter: Payer: Self-pay | Admitting: Orthopedic Surgery

## 2023-03-07 DIAGNOSIS — M170 Bilateral primary osteoarthritis of knee: Secondary | ICD-10-CM | POA: Diagnosis not present

## 2023-03-07 MED ORDER — HYALURONAN 30 MG/2ML IX SOSY
30.0000 mg | PREFILLED_SYRINGE | Freq: Once | INTRA_ARTICULAR | Status: AC
  Administered 2023-03-07: 30 mg via INTRA_ARTICULAR

## 2023-03-07 NOTE — Progress Notes (Signed)
Chief Complaint  Patient presents with   Injections    Orthovisc #1 bilateral    X-rays show bilateral arthritis left worse than right with medial compartment narrowing secondary bone changes are minimal   Encounter Diagnosis  Name Primary?   Primary osteoarthritis of both knees Yes   Left knee was aspirated about 5 cc I felt there was more in there but did not push it we injected it with Orthovisc No. 1  Right knee injected Orthovisc No. 1 as well no fluid on aspiration  The patient has consented to and requested hyaluronic acid injection in the form of  Orthovisc  The right knee is prepped with alcohol and ethyl chloride  The injection is performed with a 21-gauge needle, via inferolateral approach  No complications were noted  Appropriate instructions post injection were given   The patient has consented to and requested hyaluronic acid injection in the form of  Orthovisc   The left knee is prepped with alcohol and ethyl chloride  The injection is performed with a 21-gauge needle, via inferolateral approach  No complications were noted  Appropriate instructions post injection were given

## 2023-03-14 ENCOUNTER — Ambulatory Visit (INDEPENDENT_AMBULATORY_CARE_PROVIDER_SITE_OTHER): Payer: PPO | Admitting: Orthopedic Surgery

## 2023-03-14 ENCOUNTER — Encounter: Payer: Self-pay | Admitting: Orthopedic Surgery

## 2023-03-14 DIAGNOSIS — M170 Bilateral primary osteoarthritis of knee: Secondary | ICD-10-CM

## 2023-03-14 DIAGNOSIS — M25562 Pain in left knee: Secondary | ICD-10-CM

## 2023-03-14 DIAGNOSIS — G8929 Other chronic pain: Secondary | ICD-10-CM

## 2023-03-14 MED ORDER — HYALURONAN 30 MG/2ML IX SOSY
30.0000 mg | PREFILLED_SYRINGE | Freq: Once | INTRA_ARTICULAR | Status: AC
  Administered 2023-03-14: 30 mg via INTRA_ARTICULAR

## 2023-03-14 NOTE — Progress Notes (Signed)
Chief Complaint  Patient presents with   Injections    2nd orthovisc bilateral    Encounter Diagnoses  Name Primary?   Chronic pain of left knee Yes   Primary osteoarthritis of both knees     The patient has consented to and requested hyaluronic acid injection in the form of  Orthovisc   The right knee is prepped with alcohol and ethyl chloride  The injection is performed with a 21-gauge needle, via inferolateral approach  No complications were noted  Appropriate instructions post injection were given  The patient has consented to and requested hyaluronic acid injection in the form of  Orthovisc   The left knee is prepped with alcohol and ethyl chloride  The injection is performed with a 21-gauge needle, via inferolateral approach  No complications were noted  Appropriate instructions post injection were given

## 2023-03-19 ENCOUNTER — Other Ambulatory Visit (HOSPITAL_COMMUNITY): Payer: Self-pay

## 2023-03-19 MED ORDER — OLMESARTAN MEDOXOMIL 20 MG PO TABS
20.0000 mg | ORAL_TABLET | Freq: Every day | ORAL | 3 refills | Status: AC
  Filled 2023-03-19 – 2023-04-26 (×2): qty 90, 90d supply, fill #0
  Filled 2023-08-22 (×2): qty 90, 90d supply, fill #1
  Filled 2023-11-17: qty 90, 90d supply, fill #2
  Filled 2024-01-15 – 2024-01-24 (×2): qty 90, 90d supply, fill #3

## 2023-03-19 MED ORDER — ESCITALOPRAM OXALATE 20 MG PO TABS
20.0000 mg | ORAL_TABLET | Freq: Every day | ORAL | 3 refills | Status: DC
  Filled 2023-03-19 – 2023-04-12 (×2): qty 90, 90d supply, fill #0
  Filled 2023-04-26: qty 90, 90d supply, fill #1
  Filled 2023-10-11: qty 90, 90d supply, fill #2
  Filled 2024-01-15: qty 90, 90d supply, fill #3

## 2023-03-19 MED ORDER — ATORVASTATIN CALCIUM 80 MG PO TABS
80.0000 mg | ORAL_TABLET | Freq: Every day | ORAL | 3 refills | Status: AC
  Filled 2023-03-19 – 2023-04-26 (×2): qty 90, 90d supply, fill #0

## 2023-03-19 MED ORDER — AZITHROMYCIN 250 MG PO TABS
ORAL_TABLET | ORAL | 3 refills | Status: AC
  Filled 2023-03-19 – 2023-04-12 (×2): qty 6, 5d supply, fill #0

## 2023-03-19 MED ORDER — NEBIVOLOL HCL 10 MG PO TABS
10.0000 mg | ORAL_TABLET | Freq: Every day | ORAL | 3 refills | Status: AC
  Filled 2023-03-19 – 2023-04-26 (×2): qty 90, 90d supply, fill #0
  Filled 2023-08-22 (×2): qty 90, 90d supply, fill #1
  Filled 2023-11-17: qty 90, 90d supply, fill #2
  Filled 2024-01-15 – 2024-01-24 (×2): qty 90, 90d supply, fill #3

## 2023-03-19 MED ORDER — CLONAZEPAM 1 MG PO TABS
0.5000 mg | ORAL_TABLET | Freq: Every day | ORAL | 1 refills | Status: AC
  Filled 2023-03-19 – 2023-04-12 (×2): qty 90, 90d supply, fill #0
  Filled 2023-08-22 (×2): qty 90, 90d supply, fill #1

## 2023-03-21 ENCOUNTER — Ambulatory Visit (INDEPENDENT_AMBULATORY_CARE_PROVIDER_SITE_OTHER): Payer: PPO | Admitting: Orthopedic Surgery

## 2023-03-21 DIAGNOSIS — M170 Bilateral primary osteoarthritis of knee: Secondary | ICD-10-CM

## 2023-03-21 DIAGNOSIS — M1712 Unilateral primary osteoarthritis, left knee: Secondary | ICD-10-CM | POA: Diagnosis not present

## 2023-03-21 DIAGNOSIS — M1711 Unilateral primary osteoarthritis, right knee: Secondary | ICD-10-CM | POA: Diagnosis not present

## 2023-03-21 MED ORDER — HYALURONAN 30 MG/2ML IX SOSY
30.0000 mg | PREFILLED_SYRINGE | Freq: Once | INTRA_ARTICULAR | Status: AC
  Administered 2023-03-21: 30 mg via INTRA_ARTICULAR

## 2023-03-21 NOTE — Progress Notes (Signed)
Chief Complaint  Patient presents with   Injections    Bilateral Orthovisc    Encounter Diagnosis  Name Primary?   Primary osteoarthritis of both knees Yes   3rd injection HA  BOTH KNEES    The patient has consented to and requested hyaluronic acid injection in the form of  Orthovisc    The left knee is prepped with alcohol and ethyl chloride  The injection is performed with a 21-gauge needle, via inferolateral approach  No complications were noted  Appropriate instructions post injection were given   The patient has consented to and requested hyaluronic acid injection in the form of  Orthovisc    The right knee is prepped with alcohol and ethyl chloride  The injection is performed with a 21-gauge needle, via inferolateral approach  No complications were noted  Appropriate instructions post injection were given

## 2023-04-04 ENCOUNTER — Other Ambulatory Visit (HOSPITAL_COMMUNITY): Payer: Self-pay

## 2023-04-11 ENCOUNTER — Other Ambulatory Visit (HOSPITAL_COMMUNITY): Payer: Self-pay

## 2023-04-12 ENCOUNTER — Other Ambulatory Visit (HOSPITAL_COMMUNITY): Payer: Self-pay

## 2023-04-15 ENCOUNTER — Other Ambulatory Visit (HOSPITAL_COMMUNITY): Payer: Self-pay

## 2023-04-26 ENCOUNTER — Other Ambulatory Visit (HOSPITAL_COMMUNITY): Payer: Self-pay

## 2023-04-26 MED ORDER — CLONAZEPAM 1 MG PO TABS
0.5000 mg | ORAL_TABLET | Freq: Every day | ORAL | 0 refills | Status: AC
  Filled 2023-04-26 (×2): qty 30, 30d supply, fill #0

## 2023-04-29 ENCOUNTER — Other Ambulatory Visit (HOSPITAL_COMMUNITY): Payer: Self-pay

## 2023-05-01 ENCOUNTER — Other Ambulatory Visit (HOSPITAL_COMMUNITY): Payer: Self-pay

## 2023-08-13 ENCOUNTER — Other Ambulatory Visit (HOSPITAL_COMMUNITY): Payer: Self-pay | Admitting: Internal Medicine

## 2023-08-13 DIAGNOSIS — Z1231 Encounter for screening mammogram for malignant neoplasm of breast: Secondary | ICD-10-CM

## 2023-08-14 ENCOUNTER — Other Ambulatory Visit (HOSPITAL_COMMUNITY): Payer: Self-pay

## 2023-08-22 ENCOUNTER — Other Ambulatory Visit (HOSPITAL_COMMUNITY): Payer: Self-pay

## 2023-08-22 ENCOUNTER — Other Ambulatory Visit: Payer: Self-pay

## 2023-08-23 ENCOUNTER — Ambulatory Visit (HOSPITAL_COMMUNITY)
Admission: RE | Admit: 2023-08-23 | Discharge: 2023-08-23 | Disposition: A | Discharge status: Home / Self Care | Payer: PPO | Source: Ambulatory Visit | Attending: Internal Medicine | Admitting: Internal Medicine

## 2023-08-23 ENCOUNTER — Encounter (HOSPITAL_COMMUNITY): Payer: Self-pay

## 2023-08-23 DIAGNOSIS — Z1231 Encounter for screening mammogram for malignant neoplasm of breast: Secondary | ICD-10-CM | POA: Insufficient documentation

## 2023-11-17 ENCOUNTER — Other Ambulatory Visit: Payer: Self-pay

## 2023-11-21 ENCOUNTER — Other Ambulatory Visit (HOSPITAL_COMMUNITY): Payer: Self-pay

## 2023-12-20 ENCOUNTER — Other Ambulatory Visit (HOSPITAL_COMMUNITY): Payer: Self-pay

## 2024-01-15 ENCOUNTER — Other Ambulatory Visit (HOSPITAL_COMMUNITY): Payer: Self-pay

## 2024-01-16 ENCOUNTER — Other Ambulatory Visit (HOSPITAL_COMMUNITY): Payer: Self-pay

## 2024-01-24 ENCOUNTER — Other Ambulatory Visit (HOSPITAL_COMMUNITY): Payer: Self-pay

## 2024-04-23 ENCOUNTER — Other Ambulatory Visit (HOSPITAL_COMMUNITY): Payer: Self-pay

## 2024-04-23 MED ORDER — ESCITALOPRAM OXALATE 20 MG PO TABS
20.0000 mg | ORAL_TABLET | Freq: Every day | ORAL | 0 refills | Status: DC
  Filled 2024-04-23: qty 90, 90d supply, fill #0

## 2024-04-24 ENCOUNTER — Other Ambulatory Visit (HOSPITAL_COMMUNITY): Payer: Self-pay

## 2024-05-06 ENCOUNTER — Other Ambulatory Visit (HOSPITAL_COMMUNITY): Payer: Self-pay

## 2024-05-06 MED ORDER — CLONAZEPAM 1 MG PO TABS
0.5000 mg | ORAL_TABLET | Freq: Two times a day (BID) | ORAL | 2 refills | Status: AC
  Filled 2024-05-06: qty 180, 90d supply, fill #0
  Filled 2024-08-18: qty 180, 90d supply, fill #1

## 2024-05-06 MED ORDER — ATORVASTATIN CALCIUM 80 MG PO TABS
80.0000 mg | ORAL_TABLET | Freq: Every day | ORAL | 3 refills | Status: AC
  Filled 2024-05-06: qty 90, 90d supply, fill #0
  Filled 2024-08-18: qty 90, 90d supply, fill #1

## 2024-05-06 MED ORDER — ESCITALOPRAM OXALATE 20 MG PO TABS
20.0000 mg | ORAL_TABLET | Freq: Every day | ORAL | 3 refills | Status: AC
  Filled 2024-05-06 – 2024-07-21 (×2): qty 90, 90d supply, fill #0
  Filled 2024-10-16: qty 90, 90d supply, fill #1

## 2024-05-06 MED ORDER — NEBIVOLOL HCL 10 MG PO TABS
10.0000 mg | ORAL_TABLET | Freq: Every day | ORAL | 3 refills | Status: AC
  Filled 2024-05-06: qty 90, 90d supply, fill #0
  Filled 2024-08-18: qty 90, 90d supply, fill #1

## 2024-05-06 MED ORDER — OLMESARTAN MEDOXOMIL 20 MG PO TABS
20.0000 mg | ORAL_TABLET | Freq: Every day | ORAL | 3 refills | Status: AC
  Filled 2024-05-06: qty 90, 90d supply, fill #0
  Filled 2024-08-18: qty 90, 90d supply, fill #1

## 2024-05-07 ENCOUNTER — Other Ambulatory Visit (HOSPITAL_COMMUNITY): Payer: Self-pay

## 2024-06-01 ENCOUNTER — Other Ambulatory Visit (HOSPITAL_COMMUNITY): Payer: Self-pay

## 2024-06-01 MED ORDER — SUCRALFATE 1 G PO TABS
1.0000 g | ORAL_TABLET | Freq: Four times a day (QID) | ORAL | 11 refills | Status: AC
  Filled 2024-06-01: qty 120, 30d supply, fill #0
  Filled 2024-07-21: qty 120, 30d supply, fill #1
  Filled 2024-09-17: qty 120, 30d supply, fill #2

## 2024-07-21 ENCOUNTER — Other Ambulatory Visit (HOSPITAL_COMMUNITY): Payer: Self-pay

## 2024-07-23 ENCOUNTER — Other Ambulatory Visit (HOSPITAL_COMMUNITY): Payer: Self-pay

## 2024-08-18 ENCOUNTER — Other Ambulatory Visit (HOSPITAL_COMMUNITY): Payer: Self-pay

## 2024-08-18 ENCOUNTER — Other Ambulatory Visit (HOSPITAL_COMMUNITY): Payer: Self-pay | Admitting: Internal Medicine

## 2024-08-18 DIAGNOSIS — Z1231 Encounter for screening mammogram for malignant neoplasm of breast: Secondary | ICD-10-CM

## 2024-08-19 ENCOUNTER — Other Ambulatory Visit: Payer: Self-pay

## 2024-08-24 ENCOUNTER — Ambulatory Visit (HOSPITAL_COMMUNITY)
Admission: RE | Admit: 2024-08-24 | Discharge: 2024-08-24 | Disposition: A | Discharge status: Home / Self Care | Source: Ambulatory Visit

## 2024-08-24 DIAGNOSIS — Z1231 Encounter for screening mammogram for malignant neoplasm of breast: Secondary | ICD-10-CM | POA: Insufficient documentation

## 2024-10-19 ENCOUNTER — Other Ambulatory Visit (HOSPITAL_COMMUNITY): Payer: Self-pay

## 2024-11-04 ENCOUNTER — Other Ambulatory Visit: Payer: Self-pay

## 2024-11-04 ENCOUNTER — Other Ambulatory Visit (HOSPITAL_COMMUNITY): Payer: Self-pay

## 2024-11-04 MED ORDER — ATORVASTATIN CALCIUM 80 MG PO TABS
80.0000 mg | ORAL_TABLET | Freq: Every day | ORAL | 1 refills | Status: AC
  Filled 2024-11-04: qty 90, 90d supply, fill #0

## 2024-11-04 MED ORDER — NEBIVOLOL HCL 10 MG PO TABS
10.0000 mg | ORAL_TABLET | Freq: Every day | ORAL | 1 refills | Status: AC
  Filled 2024-11-04: qty 90, 90d supply, fill #0

## 2024-11-04 MED ORDER — ESCITALOPRAM OXALATE 20 MG PO TABS: 20.0000 mg | ORAL_TABLET | Freq: Every day | ORAL | 1 refills | Status: AC

## 2024-11-04 MED ORDER — OLMESARTAN MEDOXOMIL 20 MG PO TABS
20.0000 mg | ORAL_TABLET | Freq: Every day | ORAL | 1 refills | Status: AC
  Filled 2024-11-04: qty 90, 90d supply, fill #0
